# Patient Record
Sex: Female | Born: 1960
Health system: Southern US, Community
[De-identification: ages and names within clinical notes are randomized; demographics above are authoritative.]

## PROBLEM LIST (undated history)

## (undated) DIAGNOSIS — Z860100 Personal history of colon polyps, unspecified: Secondary | ICD-10-CM

## (undated) DIAGNOSIS — E7800 Pure hypercholesterolemia, unspecified: Secondary | ICD-10-CM

## (undated) DIAGNOSIS — K635 Polyp of colon: Secondary | ICD-10-CM

## (undated) DIAGNOSIS — E349 Endocrine disorder, unspecified: Secondary | ICD-10-CM

## (undated) DIAGNOSIS — K279 Peptic ulcer, site unspecified, unspecified as acute or chronic, without hemorrhage or perforation: Secondary | ICD-10-CM

## (undated) DIAGNOSIS — K5900 Constipation, unspecified: Secondary | ICD-10-CM

## (undated) DIAGNOSIS — Z8742 Personal history of other diseases of the female genital tract: Secondary | ICD-10-CM

## (undated) DIAGNOSIS — T7840XA Allergy, unspecified, initial encounter: Secondary | ICD-10-CM

## (undated) HISTORY — PX: COLONOSCOPY: SHX174

## (undated) HISTORY — DX: Personal history of colonic polyps: Z86.010

## (undated) HISTORY — DX: Allergy, unspecified, initial encounter: T78.40XA

## (undated) HISTORY — DX: Constipation, unspecified: K59.00

## (undated) HISTORY — DX: Peptic ulcer, site unspecified, unspecified as acute or chronic, without hemorrhage or perforation: K27.9

## (undated) HISTORY — PX: UPPER GASTROINTESTINAL ENDOSCOPY: SHX188

## (undated) HISTORY — DX: Personal history of other diseases of the female genital tract: Z87.42

## (undated) HISTORY — DX: Endocrine disorder, unspecified: E34.9

## (undated) HISTORY — PX: BUNIONECTOMY: SHX129

## (undated) HISTORY — DX: Pure hypercholesterolemia, unspecified: E78.00

## (undated) HISTORY — DX: Polyp of colon: K63.5

## (undated) HISTORY — PX: POLYPECTOMY: SHX149

## (undated) HISTORY — DX: Personal history of colon polyps, unspecified: Z86.0100

## (undated) HISTORY — PX: WISDOM TOOTH EXTRACTION: SHX21

## (undated) HISTORY — PX: OSTEOCHONDROMA EXCISION: SHX2137

---

## 2015-08-13 DIAGNOSIS — Z86010 Personal history of colonic polyps: Secondary | ICD-10-CM | POA: Insufficient documentation

## 2015-08-13 DIAGNOSIS — Z860101 Personal history of adenomatous and serrated colon polyps: Secondary | ICD-10-CM | POA: Insufficient documentation

## 2017-05-23 ENCOUNTER — Other Ambulatory Visit: Payer: Self-pay

## 2017-05-23 DIAGNOSIS — Z0000 Encounter for general adult medical examination without abnormal findings: Secondary | ICD-10-CM

## 2017-05-23 DIAGNOSIS — Z1329 Encounter for screening for other suspected endocrine disorder: Secondary | ICD-10-CM

## 2017-05-23 DIAGNOSIS — Z13220 Encounter for screening for lipoid disorders: Secondary | ICD-10-CM

## 2017-05-23 DIAGNOSIS — Z1321 Encounter for screening for nutritional disorder: Secondary | ICD-10-CM

## 2017-06-08 ENCOUNTER — Other Ambulatory Visit: Payer: 59 | Admitting: Internal Medicine

## 2017-06-08 DIAGNOSIS — Z13220 Encounter for screening for lipoid disorders: Secondary | ICD-10-CM

## 2017-06-08 DIAGNOSIS — Z0000 Encounter for general adult medical examination without abnormal findings: Secondary | ICD-10-CM | POA: Diagnosis not present

## 2017-06-08 DIAGNOSIS — Z1329 Encounter for screening for other suspected endocrine disorder: Secondary | ICD-10-CM | POA: Diagnosis not present

## 2017-06-08 DIAGNOSIS — Z1321 Encounter for screening for nutritional disorder: Secondary | ICD-10-CM | POA: Diagnosis not present

## 2017-06-09 LAB — CBC WITH DIFFERENTIAL/PLATELET
BASOS PCT: 1.3 %
Basophils Absolute: 61 cells/uL (ref 0–200)
EOS ABS: 132 {cells}/uL (ref 15–500)
Eosinophils Relative: 2.8 %
HCT: 38.9 % (ref 35.0–45.0)
HEMOGLOBIN: 13.3 g/dL (ref 11.7–15.5)
Lymphs Abs: 1880 cells/uL (ref 850–3900)
MCH: 30.9 pg (ref 27.0–33.0)
MCHC: 34.2 g/dL (ref 32.0–36.0)
MCV: 90.3 fL (ref 80.0–100.0)
MONOS PCT: 12 %
MPV: 9.4 fL (ref 7.5–12.5)
NEUTROS ABS: 2063 {cells}/uL (ref 1500–7800)
Neutrophils Relative %: 43.9 %
Platelets: 334 10*3/uL (ref 140–400)
RBC: 4.31 10*6/uL (ref 3.80–5.10)
RDW: 12.2 % (ref 11.0–15.0)
Total Lymphocyte: 40 %
WBC mixed population: 564 cells/uL (ref 200–950)
WBC: 4.7 10*3/uL (ref 3.8–10.8)

## 2017-06-09 LAB — COMPLETE METABOLIC PANEL WITH GFR
AG Ratio: 1.5 (calc) (ref 1.0–2.5)
ALT: 15 U/L (ref 6–29)
AST: 19 U/L (ref 10–35)
Albumin: 4.4 g/dL (ref 3.6–5.1)
Alkaline phosphatase (APISO): 53 U/L (ref 33–130)
BUN: 15 mg/dL (ref 7–25)
CALCIUM: 9.5 mg/dL (ref 8.6–10.4)
CO2: 29 mmol/L (ref 20–32)
Chloride: 106 mmol/L (ref 98–110)
Creat: 0.8 mg/dL (ref 0.50–1.05)
GFR, EST NON AFRICAN AMERICAN: 82 mL/min/{1.73_m2} (ref >=60)
GFR, Est African American: 95 mL/min/{1.73_m2} (ref >=60)
Globulin: 2.9 g/dL (calc) (ref 1.9–3.7)
Glucose, Bld: 106 mg/dL — ABNORMAL HIGH (ref 65–99)
Potassium: 4.4 mmol/L (ref 3.5–5.3)
Sodium: 140 mmol/L (ref 135–146)
Total Bilirubin: 0.5 mg/dL (ref 0.2–1.2)
Total Protein: 7.3 g/dL (ref 6.1–8.1)

## 2017-06-09 LAB — TSH: TSH: 1.46 mIU/L (ref 0.40–4.50)

## 2017-06-09 LAB — LIPID PANEL
CHOL/HDL RATIO: 3.7 (calc) (ref <5.0)
Cholesterol: 244 mg/dL — ABNORMAL HIGH (ref <200)
HDL: 66 mg/dL (ref >50)
LDL Cholesterol (Calc): 154 mg/dL (calc) — ABNORMAL HIGH
NON-HDL CHOLESTEROL (CALC): 178 mg/dL — AB (ref <130)
Triglycerides: 118 mg/dL (ref <150)

## 2017-06-09 LAB — VITAMIN D 25 HYDROXY (VIT D DEFICIENCY, FRACTURES): VIT D 25 HYDROXY: 31 ng/mL (ref 30–100)

## 2017-06-15 ENCOUNTER — Encounter: Payer: Self-pay | Admitting: Internal Medicine

## 2017-06-15 ENCOUNTER — Ambulatory Visit (INDEPENDENT_AMBULATORY_CARE_PROVIDER_SITE_OTHER): Payer: 59 | Admitting: Internal Medicine

## 2017-06-15 ENCOUNTER — Other Ambulatory Visit (HOSPITAL_COMMUNITY)
Admission: RE | Admit: 2017-06-15 | Discharge: 2017-06-15 | Disposition: A | Discharge status: Home / Self Care | Payer: 59 | Source: Ambulatory Visit | Attending: Internal Medicine | Admitting: Internal Medicine

## 2017-06-15 VITALS — BP 102/80 | HR 72 | Ht 62.0 in | Wt 165.0 lb

## 2017-06-15 DIAGNOSIS — J302 Other seasonal allergic rhinitis: Secondary | ICD-10-CM

## 2017-06-15 DIAGNOSIS — R232 Flushing: Secondary | ICD-10-CM | POA: Insufficient documentation

## 2017-06-15 DIAGNOSIS — R7309 Other abnormal glucose: Secondary | ICD-10-CM

## 2017-06-15 DIAGNOSIS — Z0000 Encounter for general adult medical examination without abnormal findings: Secondary | ICD-10-CM | POA: Diagnosis not present

## 2017-06-15 DIAGNOSIS — Z124 Encounter for screening for malignant neoplasm of cervix: Secondary | ICD-10-CM | POA: Diagnosis not present

## 2017-06-15 LAB — POCT URINALYSIS DIPSTICK
APPEARANCE: NORMAL
Bilirubin, UA: NEGATIVE
Glucose, UA: NEGATIVE
KETONES UA: NEGATIVE
Leukocytes, UA: NEGATIVE
NITRITE UA: NEGATIVE
ODOR: NORMAL
PH UA: 6 (ref 5.0–8.0)
PROTEIN UA: NEGATIVE
RBC UA: NEGATIVE
Spec Grav, UA: 1.015 (ref 1.010–1.025)
UROBILINOGEN UA: 0.2 U/dL

## 2017-06-15 MED ORDER — AZELASTINE-FLUTICASONE 137-50 MCG/ACT NA SUSP: NASAL | 1 refills | Status: DC

## 2017-06-15 MED ORDER — BEPOTASTINE BESILATE 1.5 % OP SOLN: 1.0000 [drp] | Freq: Two times a day (BID) | OPHTHALMIC | 1 refills | Status: DC

## 2017-06-15 MED ORDER — CLONIDINE HCL 0.1 MG PO TABS: 0.1000 mg | ORAL_TABLET | Freq: Two times a day (BID) | ORAL | 11 refills | Status: DC

## 2017-06-15 MED ORDER — CETIRIZINE HCL 10 MG PO CHEW: 10.0000 mg | CHEWABLE_TABLET | Freq: Every day | ORAL | 3 refills | Status: DC

## 2017-06-15 MED ORDER — FLUOXETINE HCL 20 MG PO TABS: 20.0000 mg | ORAL_TABLET | Freq: Every day | ORAL | 3 refills | Status: DC

## 2017-06-15 MED FILL — CloNIDine HCL 0.1 MG TAB: 0.1 | 30 days supply | Qty: 60 | Fill #0

## 2017-06-15 NOTE — Progress Notes (Signed)
Subjective:    Patient ID: Destiny Lewis, female    DOB: [birth year], 57 y.o.   MRN: [id number]  HPI  Pleasant 57 year old Female presents to the office for the first time today.  She and her husband moved here from [facility] around December 2018.  She has a daughter who lives here and is a Marine scientist.  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  He is a retired Immunologist.  She is a native of [place].  She has no history of serious illnesses or accidents.  She has allergic rhinitis and has failed [place] somewhat bothersome with allergies.  We will make referral to allergist for her.  She is currently using Zyrtec, and a combination of Flonase and Azelatine twice a day.  She also uses Bepreve eyedrops bid for allergic conjunctivitis.   osteochondroma left shin 1988, left foot  bunionectomy 1983, bunionectomy right foot June 14, 2004.  She had a colonoscopy done in 2015/06/15 which was her first colonoscopy.  She had 3 or 4 adenomatous polyps.  She was told to come back in a year but she had no health insurance coverage in 2016/06/14 so she deferred that procedure.  She will check with Dr. Carlean Purl to see if it is okay for her to wait 3years a 20/20 for her next exam.  She is allergic to Zithromax it causes blisters on her chest  2 pregnancies and no miscarriages.  Social history: She has a daughter age 7 and a son age 3.  Daughter has had issues with eating disorder and depression but currently doing well.  Son is had issues with anxiety.  One sister age 80 with history of metastatic breast cancer deceased in June 14, 2008.  One sister age 86 with history of Hashimoto's thyroiditis with vitiligo and GI problems.  One brother age 69 with history of MI in 06/15/14.  Another brother in excellent health at age 26.  Mother died at age 41 of congestive heart failure.  Father age 59 living with history of COPD and macular degeneration.  Patient does not smoke.  Social alcohol consumption consisting of 8  ounces of wine daily.  Has been trying to exercise and is on weight watchers.  She had a late menopause.  Is experiencing vaginal dryness and hot flashes.  Dr. internal still prescribed clonidine twice daily for hot flashes and that seems to help.  She is also on Prozac 20 mg daily for depression.  Old records indicate she has a history of peptic ulcer disease.  Records also indicates she was a 157.8 pounds in June 2018.  At that time she was exercising regularly and watching her diet.  She had a hemoglobin A1c in 06-15-2015 to 5.5%.  This will be repeated today due to elevated serum glucose.  Review of her labs from Jun 15, 2015 were within normal limits.  She had a Pap smear which was normal in 06-15-2014.  In 2014/06/15 she had a tendon tear right wrist due to an accident while exercising in the gym and was treated with a long-arm cast for 10 days.  She had tetanus immunization in 06/15/14 in [place].  She has been taking baby aspirin daily to prevent colon cancer according to old records.  This was suggested by her previous physician. Old records indicate she had Prevnar 68 in May 2017 and Tdap in February 2016.    Review of Systems  Constitutional: Negative.   Eyes:       History of  allergic conjunctivitis  Respiratory:       Allergy symptoms  Cardiovascular: Negative.   Gastrointestinal: Negative.   Genitourinary:       Vaginal dryness  Allergic/Immunologic: Positive for environmental allergies.  Neurological: Negative.   Psychiatric/Behavioral: Negative.        Objective:   Physical Exam  Constitutional: She is oriented to person, place, and time. She appears well-developed and well-nourished. No distress.  HENT:  Head: Normocephalic and atraumatic.  Right Ear: External ear normal.  Left Ear: External ear normal.  Mouth/Throat: Oropharynx is clear and moist.  Eyes: Pupils are equal, round, and reactive to light. Conjunctivae and EOM are normal. Right eye exhibits no discharge. Left eye exhibits no  discharge. No scleral icterus.  Neck: Neck supple. No JVD present. No thyromegaly present.  Cardiovascular: Normal rate, regular rhythm and normal heart sounds.  No murmur heard. Pulmonary/Chest: Effort normal and breath sounds normal. No respiratory distress. She has no wheezes. She has no rales. She exhibits no tenderness.  Abdominal: Soft. Bowel sounds are normal. She exhibits no distension and no mass. There is no tenderness. There is no rebound and no guarding.  Genitourinary:  Genitourinary Comments: Pap taken.  Bimanual normal.  Musculoskeletal: She exhibits no edema.  Lymphadenopathy:    She has no cervical adenopathy.  Neurological: She is alert and oriented to person, place, and time. She has normal reflexes. No cranial nerve deficit. Coordination normal.  Skin: Skin is warm and dry. No rash noted. She is not diaphoretic.  Psychiatric: She has a normal mood and affect. Her behavior is normal. Judgment and thought content normal.  Vitals reviewed.         Assessment & Plan:  Normal health maintenance exam  Vaginal dryness secondary to menopause  Hot flashes treated with clonidine  Allergic rhinitis and allergic conjunctivitis-referral to allergist for assessment  History of adenomatous colon polyps-patient to check with gastroenterologist regarding when it is ideal for her to have repeat colonoscopy.  I do not see copy of colonoscopy and old records provided.  Menopausal-I do not see copy of bone density study done in [place] and old records but patient thinks it was stable.  Recommend repeat study next year.  Health maintenance-recommend annual mammogram order placed  Plan: Hemoglobin A1c drawn due to elevated serum glucose.Marland Kitchen  Results pending.

## 2017-06-15 NOTE — Patient Instructions (Addendum)
It was a pleasure to see you today. Meds refilled.  Order for mammogram placed.  Allergy referral placed.  Return in 1 year or as needed.

## 2017-06-16 ENCOUNTER — Other Ambulatory Visit: Payer: Self-pay

## 2017-06-16 ENCOUNTER — Telehealth: Payer: Self-pay | Admitting: Internal Medicine

## 2017-06-16 LAB — HEMOGLOBIN A1C
HEMOGLOBIN A1C: 5.6 %{Hb} (ref <5.7)
MEAN PLASMA GLUCOSE: 114 (calc)
eAG (mmol/L): 6.3 (calc)

## 2017-06-16 MED ORDER — CLONIDINE HCL 0.1 MG PO TABS: 0.1000 mg | ORAL_TABLET | Freq: Two times a day (BID) | ORAL | 3 refills | Status: DC

## 2017-06-16 NOTE — Telephone Encounter (Signed)
Called patient to let her know that she needs to get in contact with Eye doctor on the eye drops Bepreve 1.5% for prior authorization. She acknowledge understanding and will contact them and thanked [place] for trying.

## 2017-06-19 LAB — CYTOLOGY - PAP: DIAGNOSIS: NEGATIVE

## 2017-07-18 ENCOUNTER — Telehealth: Payer: 59 | Admitting: Family

## 2017-07-18 DIAGNOSIS — L237 Allergic contact dermatitis due to plants, except food: Secondary | ICD-10-CM

## 2017-07-18 MED ORDER — PREDNISONE 5 MG PO TABS: 5.0000 mg | ORAL_TABLET | ORAL | 0 refills | Status: DC

## 2017-07-18 NOTE — Progress Notes (Signed)
Thank you for the details you included in the comment boxes. Those details are very helpful in determining the best course of treatment for you and help us to provide the best care.  We are sorry that you are not feeing well.  Here is how we plan to help!  Based on what you have shared with me it looks like you have had an allergic reaction to the oily resin from a group of plants.  This resin is very sticky, so it easily attaches to your skin, clothing, tools equipment, and pet's fur.    This blistering rash is often called poison ivy rash although it can come from contact with the leaves, stems and roots of poison ivy, poison oak and poison sumac.  The oily resin contains urushiol (u-ROO-she-ol) that produces a skin rash on exposed skin.  The severity of the rash depends on the amount of urushiol that gets on your skin.  A section of skin with more urushiol on it may develop a rash sooner.  The rash usually develops 12-48 hours after exposure and can last two to three weeks.  Your skin must come in direct contact with the plant's oil to be affected.  Blister fluid doesn't spread the rash.  However, if you come into contact with a piece of clothing or pet fur that has urushiol on it, the rash may spread out.  You can also transfer the oil to other parts of your body with your fingers.  Often the rash looks like a straight line because of the way the plant brushes against your skin.    I have developed the following plan to treat your condition Since your rash is widespread or has resulted in a large number of blisters, I have prescribed an oral corticosteroid.  Please follow these recommendations:  I have sent a prednisone dose pack to your chosen pharmacy. Be sure to follow the instructions carefully and complete the entire prescription. You may use Benadryl or Caladryl topical lotions to sooth the itch and remember cool, not hot, showers and baths can help relieve the itching!  Place cool, wet  compresses on the affected area for 15-30 minutes several times a day.  You may also take oral antihistamines, such as diphenhydramine (Benadryl, others), which may also help you sleep better.  Watch your skin for any purulent (pus) drainage or red streaking from the site.  If this occurs, contact your provider.  You may require an antibiotic for a skin infection.  Make sure that the clothes you were wearing as well as any towels or sheets that may have come in contact with the oil (urushiol) are washed in detergent and hot water.         What can you do to prevent this rash?  Avoid the plants.  Learn how to identify poison ivy, poison oak and poison sumac in all seasons.  When hiking or engaging in other activities that might expose you to these plants, try to stay on cleared pathways.  If camping, make sure you pitch your tent in an area free of these plants.  Keep pets from running through wooded areas so that urushiol doesn't accidentally stick to their fur, which you may touch.  Remove or kill the plants.  In your yard, you can get rid of poison ivy by applying an herbicide or pulling it out of the ground, including the roots, while wearing heavy gloves.  Afterward remove the gloves and thoroughly wash them and   your hands.  Don't burn poison ivy or related plants because the urushiol can be carried by smoke.  Wear protective clothing.  If needed, protect your skin by wearing socks, boots, pants, long sleeves and vinyl gloves.  Wash your skin right away.  Washing off the oil with soap and water within 30 minutes of exposure may reduce your chances of getting a poison ivy rash.  Even washing after an hour or so can help reduce the severity of the rash.  If you walk through some poison ivy and then later touch your shoes, you may get some urushiol on your hands, which may then transfer to your face or body by touching or rubbing.  If the contaminated object isn't cleaned, the urushiol on it can still  cause a skin reaction years later.    Be careful not to reuse towels after you have washed your skin.  Also carefully wash clothing in detergent and hot water to remove all traces of the oil.  Handle contaminated clothing carefully so you don't transfer the urushiol to yourself, furniture, rugs or appliances.  Remember that pets can carry the oil on their fur and paws.  If you think your pet may be contaminated with urushiol, put on some long rubber gloves and give your pet a bath.  Finally, be careful not to burn these plants as the smoke can contain traces of the oil.  Inhaling the smoke may result in difficulty breathing. If that occurred you should see a physician as soon as possible.  See your doctor right away if:   The reaction is severe or widespread  You inhaled the smoke from burning poison ivy and are having difficulty breathing  Your skin continues to swell  The rash affects your eyes, mouth or genitals  Blisters are oozing pus  You develop a fever greater than 100 F (37.8 C)  The rash doesn't get better within a few weeks.  If you scratch the poison ivy rash, bacteria under your fingernails may cause the skin to become infected.  See your doctor if pus starts oozing from the blisters.  Treatment generally includes antibiotics.  Poison ivy treatments are usually limited to self-care methods.  And the rash typically goes away on its own in two to three weeks.     If the rash is widespread or results in a large number of blisters, your doctor may prescribe an oral corticosteroid, such as prednisone.  If a bacterial infection has developed at the rash site, your doctor may give you a prescription for an oral antibiotic.  MAKE SURE YOU   Understand these instructions.  Will watch your condition.  Will get help right away if you are not doing well or get worse.  Thank you for choosing an e-visit. Your e-visit answers were reviewed by a board certified advanced  clinical practitioner to complete your personal care plan. Depending upon the condition, your plan could have included both over the counter or prescription medications.  Please review your pharmacy choice. If there is a problem you may use MyChart messaging to have the prescription routed to another pharmacy.   Your safety is important to us. If you have drug allergies check your prescription carefully.  You can use MyChart to ask questions about today's visit, request a non-urgent call back, or ask for a work or school excuse for 24 hours related to this e-Visit. If it has been greater than 24 hours you will need to follow up with   your provider, or enter a new e-Visit to address those concerns.   You will get an email in the next two days asking about your experience. I hope that your e-visit has been valuable and will speed your recovery Thank you for choosing an e-visit.       

## 2017-07-20 ENCOUNTER — Ambulatory Visit: Payer: 59 | Admitting: Allergy

## 2017-07-24 MED FILL — CloNIDine HCL 0.1 MG TAB: 0.1 | 30 days supply | Qty: 60 | Fill #1

## 2017-07-25 ENCOUNTER — Other Ambulatory Visit: Payer: Self-pay | Admitting: Urgent Care

## 2017-07-25 DIAGNOSIS — L237 Allergic contact dermatitis due to plants, except food: Secondary | ICD-10-CM

## 2017-07-25 MED ORDER — PREDNISONE 10 MG PO TABS: ORAL_TABLET | ORAL | 0 refills | Status: DC

## 2017-07-25 NOTE — Progress Notes (Signed)
Telephone encounter with patient in which patient reported coming into contact with poison oak. Has had contact dermatitis of arms that spread to torso. She has tried low dose prednisone of 5mg . Symptoms persist. Will use 10 day prednisone course.

## 2017-07-27 ENCOUNTER — Ambulatory Visit
Admission: RE | Admit: 2017-07-27 | Discharge: 2017-07-27 | Disposition: A | Discharge status: Home / Self Care | Payer: 59 | Source: Ambulatory Visit | Attending: Internal Medicine | Admitting: Internal Medicine

## 2017-07-27 DIAGNOSIS — Z1231 Encounter for screening mammogram for malignant neoplasm of breast: Secondary | ICD-10-CM | POA: Diagnosis not present

## 2017-07-27 DIAGNOSIS — Z0000 Encounter for general adult medical examination without abnormal findings: Secondary | ICD-10-CM

## 2017-07-31 ENCOUNTER — Other Ambulatory Visit: Payer: Self-pay | Admitting: Urgent Care

## 2017-07-31 DIAGNOSIS — L237 Allergic contact dermatitis due to plants, except food: Secondary | ICD-10-CM

## 2017-07-31 MED ORDER — PREDNISONE 10 MG PO TABS: ORAL_TABLET | ORAL | 0 refills | Status: DC

## 2017-07-31 MED FILL — predniSONE 10 MG TABS: 10 | 10 days supply | Qty: 30 | Fill #0

## 2017-08-21 MED FILL — CloNIDine HCL 0.1 MG TAB: 0.1 | 30 days supply | Qty: 60 | Fill #2

## 2017-08-21 MED FILL — FLUoxetine HCL 20 MG CAPS: 20 | 90 days supply | Qty: 90 | Fill #0

## 2017-09-01 ENCOUNTER — Other Ambulatory Visit: Payer: Self-pay

## 2017-09-01 DIAGNOSIS — H524 Presbyopia: Secondary | ICD-10-CM | POA: Diagnosis not present

## 2017-09-01 MED ORDER — VALACYCLOVIR HCL 500 MG PO TABS: ORAL_TABLET | ORAL | 11 refills | Status: DC

## 2017-09-01 MED FILL — VALACYCLOVIR HCL 500 MG TAB: 500 | 5 days supply | Qty: 10 | Fill #0

## 2017-09-01 NOTE — Telephone Encounter (Signed)
Patient called states she is having a herpes outbreak and is needing a refill on her medication.

## 2017-09-06 ENCOUNTER — Ambulatory Visit: Payer: 59 | Admitting: Allergy

## 2017-09-06 ENCOUNTER — Encounter: Payer: Self-pay | Admitting: Allergy

## 2017-09-06 VITALS — BP 116/78 | HR 76 | Temp 98.0°F | Resp 16 | Ht 61.5 in | Wt 160.4 lb

## 2017-09-06 DIAGNOSIS — J309 Allergic rhinitis, unspecified: Secondary | ICD-10-CM

## 2017-09-06 DIAGNOSIS — H1010 Acute atopic conjunctivitis, unspecified eye: Secondary | ICD-10-CM | POA: Diagnosis not present

## 2017-09-06 MED ORDER — LEVOCETIRIZINE DIHYDROCHLORIDE 5 MG PO TABS: 5.0000 mg | ORAL_TABLET | Freq: Every evening | ORAL | 5 refills | Status: DC

## 2017-09-06 MED ORDER — IPRATROPIUM BROMIDE 0.03 % NA SOLN: 2.0000 | Freq: Three times a day (TID) | NASAL | 5 refills | Status: DC

## 2017-09-06 MED ORDER — OLOPATADINE HCL 0.2 % OP SOLN: 1.0000 [drp] | Freq: Every day | OPHTHALMIC | 5 refills | Status: DC | PRN

## 2017-09-06 MED FILL — LEVOCETIRIZINE 5 MG TABLET: 5 | 30 days supply | Qty: 30 | Fill #0

## 2017-09-06 MED FILL — IPRATROPIUM 0.03% SPRAY: 0.03 | 29 days supply | Qty: 30 | Fill #0

## 2017-09-06 MED FILL — OLOPATADINE HCL 0.2% EYE DR: 0.2 | 25 days supply | Qty: 3 | Fill #0

## 2017-09-06 NOTE — Patient Instructions (Addendum)
Allergic rhinoconjunctivitis  -Environmental allergy skin prick testing today is positive to grass, weed, mold, cockroach  -Allergen avoidance measures discussed and provided  -trial Xyzal 5mg  daily - this replaces zyrtec  -for nasal congestion/drainage use nasal atrovent 2 sprays each nostril up 3-4 times a day as needed  -For itchy, watery, red eyes use Pazeo or Pataday 1 drop each eye as needed daily  -allergen immunotherapy discussed today including protocol, benefits and risk.  Informational handout provided.  If interested in this therapuetic option you can check with your insurance carrier for coverage.  Let us know if you would like to proceed with this option.    Follow-up 4-6 months or sooner if needed

## 2017-09-06 NOTE — Progress Notes (Signed)
New Patient Note  RE: Destiny Lewis MRN: [id number] DOB: 10-05-1960 Date of Office Visit: 09/06/2017  Referring provider: Elby Showers, MD Primary care provider: Elby Showers, MD  Chief Complaint: allergies  History of present illness: Destiny Lewis is a 57 y.o. female presenting today for consultation for allergic rhinitis.    She reports her allergies over this past winter and spring were bad.  Symptoms have been improved over the summer.  Symptoms include sinus pressure, nasal congestion/drainage with PND and throat clearing, hoarsness, red/itchy eyes, itchy ears.  During the winter she used a humidier in bedroom.  She has tried zyrtec, flonase, azelastine and bepreve eye drops.  She states she took this regimen during winter and spring.  She states she still had to take "severe sinus" medication as well.  The eye drop did help with itch but not the red eye.  In the past she reports she has tried Human resources officer and Claritin without much relief of symptoms.  She has had allergy testing several years ago that was positive to trees/grass pollen she believes.  This was done in [place], [place].  She moved to [place] area about 7 months ago.  Allergy symptoms have been worse here.   She reports she usually will get 1-2 sinus infections/year treated with 1 round of antibiotics.   No history of asthma, eczema or food allergy.    Review of systems: Review of Systems  Constitutional: Negative for chills, fever and malaise/fatigue.  HENT: Positive for congestion and sinus pain. Negative for ear discharge, ear pain, nosebleeds and sore throat.   Eyes: Positive for redness. Negative for pain and discharge.  Respiratory: Negative for cough, shortness of breath and wheezing.   Cardiovascular: Negative for chest pain.  Gastrointestinal: Negative for abdominal pain, constipation, diarrhea, heartburn, nausea and vomiting.  Musculoskeletal: Negative for joint pain.  Skin: Negative for itching and rash.    Neurological: Negative for headaches.    All other systems negative unless noted above in HPI  Past medical history: Past Medical History:  Diagnosis Date  . History of colon polyps     Past surgical history: Past Surgical History:  Procedure Laterality Date  . OSTEOCHONDROMA EXCISION      Family history:  Family History  Problem Relation Age of Onset  . Skin cancer Mother   . Heart attack Mother   . Hypertension Mother   . Arthritis Mother   . Cancer Father   . Colon cancer Father   . COPD Father   . Breast cancer Sister   . Hypertension Sister   . Hypertension Brother   . Breast cancer Maternal Aunt     Social history: She lives in a home without carpeting with gas heating and central cooling.  2 dogs and 1 cat in the home.  No concern for water damage, mildew or roaches in the home.  She is a Therapist, sports in endoscopy.  No smoking history.    Medication List: Allergies as of 09/06/2017      Reactions   Azithromycin Other (See Comments)      Medication List        Accurate as of 09/06/17  4:40 PM. Always use your most recent med list.          aspirin 81 MG tablet Take 81 mg by mouth daily.   Azelastine-Fluticasone 137-50 MCG/ACT Susp Use bid   Bepotastine Besilate 1.5 % Soln Commonly known as:  BEPREVE Place 1 drop into both eyes  2 (two) times daily.   cetirizine 10 MG chewable tablet Commonly known as:  ZYRTEC Chew 1 tablet (10 mg total) by mouth daily.   cholecalciferol 1000 units tablet Commonly known as:  VITAMIN D Take 1,000 Units by mouth daily.   cloNIDine 0.1 MG tablet Commonly known as:  CATAPRES Take 1 tablet (0.1 mg total) by mouth 2 (two) times daily.   Flaxseed Oil 1200 MG Caps Take 1 capsule by mouth 1 day or 1 dose.   FLUoxetine 20 MG tablet Commonly known as:  PROZAC Take 1 tablet (20 mg total) by mouth daily.   ipratropium 0.03 % nasal spray Commonly known as:  ATROVENT Place 2 sprays into both nostrils 3 (three) times  daily.   levocetirizine 5 MG tablet Commonly known as:  XYZAL Take 1 tablet (5 mg total) by mouth every evening.   LUMIFY 0.025 % Soln Generic drug:  Brimonidine Tartrate Apply 1 drop to eye 2 (two) times daily.   magnesium gluconate 500 MG tablet Commonly known as:  MAGONATE Take 500 mg by mouth 2 (two) times daily.   Olopatadine HCl 0.2 % Soln Commonly known as:  PATADAY Place 1 drop into both eyes daily as needed.   predniSONE 10 MG tablet Commonly known as:  DELTASONE Day 1-5: Take 4 tablets daily. Day 6-10: Take 2 tablets daily. Take tablets with breakfast.   TYLENOL SINUS SEVERE 5-325-200 MG Tabs Generic drug:  Phenylephrine-APAP-guaiFENesin Take 1 capsule by mouth 2 (two) times daily.   valACYclovir 500 MG tablet Commonly known as:  VALTREX One po bid x 5 days   VITAMIN B 12 PO Take 500 mg by mouth 1 day or 1 dose.   vitamin C 1000 MG tablet Take 1,000 mg by mouth daily.       Known medication allergies: Allergies  Allergen Reactions  . Azithromycin Other (See Comments)     Physical examination: Blood pressure 116/78, pulse 76, temperature 98 F (36.7 C), temperature source Oral, resp. rate 16, height 5' 1.5" (1.562 m), weight 160 lb 6.4 oz (72.8 kg), SpO2 95 %.  General: Alert, interactive, in no acute distress. HEENT: PERRLA, TMs pearly gray, turbinates minimally edematous without discharge, post-pharynx non erythematous, mild cobblestoning of post oropharynx. Neck: Supple without lymphadenopathy. Lungs: Clear to auscultation without wheezing, rhonchi or rales. {no increased work of breathing. CV: Normal S1, S2 without murmurs. Abdomen: Nondistended, nontender. Skin: Warm and dry, without lesions or rashes. Extremities:  No clubbing, cyanosis or edema. Neuro:   Grossly intact.  Diagnositics/Labs:  Allergy testing: environmental allergy skin prick testing is positive ot [place], mugwort Intradermal testing is positive to mold mix 2, mold mix 4,  cockroach Allergy testing results were read and interpreted by provider, documented by clinical staff.   Assessment and plan:   Allergic rhinoconjunctivitis  -Environmental allergy skin prick testing today is positive to grass, weed, mold, cockroach  -Allergen avoidance measures discussed and provided  -trial Xyzal 5mg  daily - this replaces zyrtec  -for nasal congestion/drainage use nasal atrovent 2 sprays each nostril up 3-4 times a day as needed  -For itchy, watery, red eyes use Pazeo or Pataday 1 drop each eye as needed daily  -allergen immunotherapy discussed today including protocol, benefits and risk.  Informational handout provided.  If interested in this therapuetic option you can check with your insurance carrier for coverage.  Let us know if you would like to proceed with this option.    Follow-up 4-6 months or sooner if needed  I appreciate the opportunity to take part in Lecia's care. Please do not hesitate to contact me with questions.  Sincerely,   Prudy Feeler, MD Allergy/Immunology [facility]

## 2017-09-15 ENCOUNTER — Other Ambulatory Visit: Payer: Self-pay | Admitting: Urgent Care

## 2017-09-15 MED ORDER — MONTELUKAST SODIUM 10 MG PO TABS: 10.0000 mg | ORAL_TABLET | Freq: Every day | ORAL | 1 refills | Status: DC

## 2017-09-15 MED ORDER — TOBRAMYCIN 0.3 % OP SOLN: 1.0000 [drp] | OPHTHALMIC | 0 refills | Status: DC

## 2017-09-15 MED ORDER — PSEUDOEPHEDRINE HCL ER 120 MG PO TB12: 120.0000 mg | ORAL_TABLET | Freq: Two times a day (BID) | ORAL | 3 refills | Status: DC

## 2017-09-15 MED FILL — MONTELUKAST SOD 10 MG TAB: 10 | 90 days supply | Qty: 90 | Fill #0

## 2017-09-15 MED FILL — TOBRAMYCIN 0.3 % SOLN: 0.3 | 8 days supply | Qty: 5 | Fill #0

## 2017-09-15 MED FILL — CloNIDine HCL 0.1 MG TAB: 0.1 | 30 days supply | Qty: 60 | Fill #3

## 2017-09-15 NOTE — Progress Notes (Signed)
Telephone encounter with patient completed today.  Patient reports that she woke up with acute onset of bilateral eye pain, eye redness, matted eyes, frontal headache.  She is taking her allergy medications of Xyzal, Flonase.  Has previously been prescribed Singulair.  She has a history of allergic reaction to poison ivy but has not spent time in her yard or doing yard work.  Denies itching of her eyes, trauma.  Will start tobramycin to help her with bilateral conjunctivitis.  She has allergies listed for azithromycin and therefore we cannot use erythromycin.  She does not wear contact lenses.  He is to maintain her allergy medications including Pataday.

## 2017-09-18 ENCOUNTER — Other Ambulatory Visit: Payer: Self-pay | Admitting: Urgent Care

## 2017-09-18 MED ORDER — DEXAMETHASONE 0.1 % OP SUSP: 2.0000 [drp] | OPHTHALMIC | 0 refills | Status: DC

## 2017-09-18 MED FILL — PREDNISOLONE AC 1% EYE DROP: 1 | 12 days supply | Qty: 15 | Fill #0

## 2017-09-18 MED FILL — VALACYCLOVIR HCL 500 MG TAB: 500 | 5 days supply | Qty: 10 | Fill #1

## 2017-09-18 NOTE — Progress Notes (Signed)
Dexamethasone ophthalmic was too expensive.  We will instead be using Pred forte.  Prescription given by phone.

## 2017-09-18 NOTE — Progress Notes (Signed)
Telephone encounter with patient.  She reports that she had improvement when she started tobramycin but today started having issues again with both her eyes being red, itchy and stinging.  She has been using tobramycin as prescribed together with Pataday.  She does admit that she has not removed her eyelash extensions.  She does not wear contacts.  Has a history of bad allergies.  Will start patient on dexamethasone taper over the next 9 days.  She is to maintain tobramycin.  Follow-up in 24 to 48 hours if there is no improvement in her symptoms.  Patient was instructed to remove eyelash extensions and any other eye products.

## 2017-10-10 MED FILL — LEVOCETIRIZINE 5 MG TABLET: 5 | 30 days supply | Qty: 30 | Fill #1

## 2017-10-10 MED FILL — CloNIDine HCL 0.1 MG TAB: 0.1 | 30 days supply | Qty: 60 | Fill #4

## 2017-11-06 MED FILL — LEVOCETIRIZINE 5 MG TABLET: 5 | 30 days supply | Qty: 30 | Fill #2

## 2017-11-06 MED FILL — CloNIDine HCL 0.1 MG TAB: 0.1 | 30 days supply | Qty: 60 | Fill #5

## 2017-11-06 MED FILL — VALACYCLOVIR HCL 500 MG TAB: 500 | 5 days supply | Qty: 10 | Fill #2

## 2017-11-16 MED FILL — FLUoxetine HCL 20 MG CAPS: 20 | 90 days supply | Qty: 90 | Fill #1

## 2017-11-22 ENCOUNTER — Encounter: Payer: Self-pay | Admitting: Internal Medicine

## 2017-11-22 ENCOUNTER — Ambulatory Visit (AMBULATORY_SURGERY_CENTER): Payer: 59 | Admitting: *Deleted

## 2017-11-22 VITALS — Ht 63.0 in | Wt 161.0 lb

## 2017-11-22 DIAGNOSIS — Z86010 Personal history of colonic polyps: Secondary | ICD-10-CM

## 2017-11-22 DIAGNOSIS — Z800 Family history of malignant neoplasm of digestive organs: Secondary | ICD-10-CM

## 2017-11-22 NOTE — Progress Notes (Signed)
No egg or soy allergy known to patient  No issues with past sedation with any surgeries  or procedures, no intubation problems  No diet pills per patient No home 02 use per patient  No blood thinners per patient  Pt denies issues with constipation  No A fib or A flutter  EMMI video sent to pt's e mail pt declined   

## 2017-11-24 DIAGNOSIS — L821 Other seborrheic keratosis: Secondary | ICD-10-CM | POA: Diagnosis not present

## 2017-11-24 DIAGNOSIS — D225 Melanocytic nevi of trunk: Secondary | ICD-10-CM | POA: Diagnosis not present

## 2017-11-24 DIAGNOSIS — D1801 Hemangioma of skin and subcutaneous tissue: Secondary | ICD-10-CM | POA: Diagnosis not present

## 2017-11-24 DIAGNOSIS — L718 Other rosacea: Secondary | ICD-10-CM | POA: Diagnosis not present

## 2017-11-24 DIAGNOSIS — L814 Other melanin hyperpigmentation: Secondary | ICD-10-CM | POA: Diagnosis not present

## 2017-12-01 MED FILL — CloNIDine HCL 0.1 MG TAB: 0.1 | 30 days supply | Qty: 60 | Fill #6

## 2017-12-01 MED FILL — LEVOCETIRIZINE 5 MG TABLET: 5 | 30 days supply | Qty: 30 | Fill #3

## 2017-12-04 ENCOUNTER — Encounter: Payer: Self-pay | Admitting: Internal Medicine

## 2017-12-04 ENCOUNTER — Ambulatory Visit (AMBULATORY_SURGERY_CENTER): Payer: 59 | Admitting: Internal Medicine

## 2017-12-04 VITALS — BP 107/64 | HR 64 | Temp 99.1°F | Resp 13 | Ht 63.0 in | Wt 161.0 lb

## 2017-12-04 DIAGNOSIS — E669 Obesity, unspecified: Secondary | ICD-10-CM | POA: Diagnosis not present

## 2017-12-04 DIAGNOSIS — D125 Benign neoplasm of sigmoid colon: Secondary | ICD-10-CM | POA: Diagnosis not present

## 2017-12-04 DIAGNOSIS — K635 Polyp of colon: Secondary | ICD-10-CM

## 2017-12-04 DIAGNOSIS — D128 Benign neoplasm of rectum: Secondary | ICD-10-CM

## 2017-12-04 DIAGNOSIS — D127 Benign neoplasm of rectosigmoid junction: Secondary | ICD-10-CM | POA: Diagnosis not present

## 2017-12-04 DIAGNOSIS — Z800 Family history of malignant neoplasm of digestive organs: Secondary | ICD-10-CM | POA: Insufficient documentation

## 2017-12-04 DIAGNOSIS — D129 Benign neoplasm of anus and anal canal: Secondary | ICD-10-CM

## 2017-12-04 DIAGNOSIS — Z86010 Personal history of colonic polyps: Secondary | ICD-10-CM | POA: Diagnosis not present

## 2017-12-04 DIAGNOSIS — I10 Essential (primary) hypertension: Secondary | ICD-10-CM | POA: Diagnosis not present

## 2017-12-04 MED ORDER — SODIUM CHLORIDE 0.9 % IV SOLN: 500.0000 mL | Freq: Once | INTRAVENOUS | Status: DC

## 2017-12-04 NOTE — Op Note (Signed)
[place] Patient Name: Destiny Lewis Procedure Date: 12/04/2017 8:16 AM MRN: [id number] Endoscopist: Gatha Mayer , MD Age: 57 Referring MD:  Date of Birth: 05-22-1960 Gender: Female Account #: [id number] Procedure:                Colonoscopy Indications:              Surveillance: Piecemeal removal of large sessile                            adenoma last colonoscopy (< 3 yrs) Medicines:                Propofol per Anesthesia, Monitored Anesthesia Care Procedure:                Pre-Anesthesia Assessment:                           - Prior to the procedure, a History and Physical                            was performed, and patient medications and                            allergies were reviewed. The patient's tolerance of                            previous anesthesia was also reviewed. The risks                            and benefits of the procedure and the sedation                            options and risks were discussed with the patient.                            All questions were answered, and informed consent                            was obtained. Prior Anticoagulants: The patient has                            taken no previous anticoagulant or antiplatelet                            agents. ASA Grade Assessment: II - A patient with                            mild systemic disease. After reviewing the risks                            and benefits, the patient was deemed in                            satisfactory condition to undergo the procedure.  After obtaining informed consent, the colonoscope                            was passed under direct vision. Throughout the                            procedure, the patient's blood pressure, pulse, and                            oxygen saturations were monitored continuously. The                            Colonoscope was introduced through the anus and   advanced to the the cecum, identified by                            appendiceal orifice and ileocecal valve. The                            colonoscopy was performed without difficulty. The                            patient tolerated the procedure well. The quality                            of the bowel preparation was good. The bowel                            preparation used was Miralax. The ileocecal valve,                            appendiceal orifice, and rectum were photographed. Scope In: 8:29:59 AM Scope Out: 8:47:40 AM Scope Withdrawal Time: 0 hours 15 minutes 10 seconds  Total Procedure Duration: 0 hours 17 minutes 41 seconds  Findings:                 The perianal and digital rectal examinations were                            normal.                           Two sessile polyps were found in the rectum and mid                            sigmoid colon. The polyps were diminutive in size.                            These polyps were removed with a cold biopsy                            forceps. Resection and retrieval were complete.                            Verification of  patient identification for the                            specimen was done. Estimated blood loss was minimal.                           The exam was otherwise without abnormality on                            direct and retroflexion views. Complications:            No immediate complications. Estimated Blood Loss:     Estimated blood loss was minimal. Impression:               - Two diminutive polyps in the rectum and in the                            mid sigmoid colon, removed with a cold biopsy                            forceps. Resected and retrieved.                           - The examination was otherwise normal on direct                            and retroflexion views.                           - Personal history of colonic polyps. 07/2015                            adenomas - 2cm, 1 cm and 4  mm. Family history of                            colon cancer in father. Recommendation:           - Patient has a contact number available for                            emergencies. The signs and symptoms of potential                            delayed complications were discussed with the                            patient. Return to normal activities tomorrow.                            Written discharge instructions were provided to the                            patient.                           - Resume previous diet.                           -  Continue present medications.                           - Repeat colonoscopy is recommended for                            surveillance. The colonoscopy date will be                            determined after pathology results from today's                            exam become available for review. Gatha Mayer, MD 12/04/2017 8:53:50 AM This report has been signed electronically.

## 2017-12-04 NOTE — Patient Instructions (Signed)
YOU HAD AN ENDOSCOPIC PROCEDURE TODAY AT THE [facility]:   Refer to the procedure report that was given to you for any specific questions about what was found during the examination.  If the procedure report does not answer your questions, please call your gastroenterologist to clarify.  If you requested that your care partner not be given the details of your procedure findings, then the procedure report has been included in a sealed envelope for you to review at your convenience later.  YOU SHOULD EXPECT: Some feelings of bloating in the abdomen. Passage of more gas than usual.  Walking can help get rid of the air that was put into your GI tract during the procedure and reduce the bloating. If you had a lower endoscopy (such as a colonoscopy or flexible sigmoidoscopy) you may notice spotting of blood in your stool or on the toilet paper. If you underwent a bowel prep for your procedure, you may not have a normal bowel movement for a few days.  Please Note:  You might notice some irritation and congestion in your nose or some drainage.  This is from the oxygen used during your procedure.  There is no need for concern and it should clear up in a day or so.  SYMPTOMS TO REPORT IMMEDIATELY:   Following lower endoscopy (colonoscopy or flexible sigmoidoscopy):  Excessive amounts of blood in the stool  Significant tenderness or worsening of abdominal pains  Swelling of the abdomen that is new, acute  Fever of 100F or higher   For urgent or emergent issues, a gastroenterologist can be reached at any hour by calling [phone].   DIET:  We do recommend a small meal at first, but then you may proceed to your regular diet.  Drink plenty of fluids but you should avoid alcoholic beverages for 24 hours.  ACTIVITY:  You should plan to take it easy for the rest of today and you should NOT DRIVE or use heavy machinery until tomorrow (because of the sedation medicines used during the test).     FOLLOW UP: Our staff will call the number listed on your records the next business day following your procedure to check on you and address any questions or concerns that you may have regarding the information given to you following your procedure. If we do not reach you, we will leave a message.  However, if you are feeling well and you are not experiencing any problems, there is no need to return our call.  We will assume that you have returned to your regular daily activities without incident.  If any biopsies were taken you will be contacted by phone or by letter within the next 1-3 weeks.  Please call us at [phone] if you have not heard about the biopsies in 3 weeks.    SIGNATURES/CONFIDENTIALITY: You and/or your care partner have signed paperwork which will be entered into your electronic medical record.  These signatures attest to the fact that that the information above on your After Visit Summary has been reviewed and is understood.  Full responsibility of the confidentiality of this discharge information lies with you and/or your care-partner.   Handout was given to your care partner on polyps. You may resume your current medications today. Await biopsy results. Please call if any questions or concerns.   

## 2017-12-04 NOTE — Progress Notes (Signed)
No problems noted in the recovery room. maw 

## 2017-12-04 NOTE — Progress Notes (Signed)
Called to room to assist during endoscopic procedure.  Patient ID and intended procedure confirmed with present staff. Received instructions for my participation in the procedure from the performing physician.  

## 2017-12-04 NOTE — Progress Notes (Signed)
Report to PACU, RN, vss, BBS= Clear.  

## 2017-12-05 ENCOUNTER — Telehealth: Payer: Self-pay

## 2017-12-05 NOTE — Telephone Encounter (Signed)
  Follow up Call-  Call back number 12/04/2017  Permission to leave phone message Yes     Patient questions:  Do you have a fever, pain , or abdominal swelling? No. Pain Score  0 *  Have you tolerated food without any problems? Yes.    Have you been able to return to your normal activities? Yes.    Do you have any questions about your discharge instructions: Diet   No. Medications  No. Follow up visit  No.  Do you have questions or concerns about your Care? No.  Actions: * If pain score is 4 or above: No action needed, pain <4.

## 2017-12-11 DIAGNOSIS — J310 Chronic rhinitis: Secondary | ICD-10-CM | POA: Diagnosis not present

## 2017-12-11 DIAGNOSIS — R51 Headache: Secondary | ICD-10-CM | POA: Diagnosis not present

## 2017-12-11 DIAGNOSIS — J343 Hypertrophy of nasal turbinates: Secondary | ICD-10-CM | POA: Diagnosis not present

## 2017-12-11 DIAGNOSIS — J342 Deviated nasal septum: Secondary | ICD-10-CM | POA: Diagnosis not present

## 2017-12-11 MED FILL — FLUTICASONE PROP 50 MCG SPR: 50 | 30 days supply | Qty: 16 | Fill #0

## 2017-12-12 ENCOUNTER — Encounter: Payer: Self-pay | Admitting: Internal Medicine

## 2017-12-12 NOTE — Progress Notes (Signed)
1 adenoma 1 benign mucosa recall 2024 My Chart

## 2017-12-13 ENCOUNTER — Other Ambulatory Visit (HOSPITAL_COMMUNITY): Payer: Self-pay | Admitting: Otolaryngology

## 2017-12-13 DIAGNOSIS — R51 Headache: Principal | ICD-10-CM

## 2017-12-13 DIAGNOSIS — G8929 Other chronic pain: Secondary | ICD-10-CM

## 2017-12-13 DIAGNOSIS — J329 Chronic sinusitis, unspecified: Secondary | ICD-10-CM

## 2017-12-26 MED FILL — CloNIDine HCL 0.1 MG TAB: 0.1 | 30 days supply | Qty: 60 | Fill #7

## 2017-12-26 MED FILL — LEVOCETIRIZINE 5 MG TABLET: 5 | 30 days supply | Qty: 30 | Fill #4

## 2017-12-29 MED FILL — VALACYCLOVIR HCL 500 MG TAB: 500 | 5 days supply | Qty: 10 | Fill #3

## 2018-01-10 ENCOUNTER — Ambulatory Visit: Payer: 59 | Admitting: Allergy

## 2018-01-11 ENCOUNTER — Ambulatory Visit (HOSPITAL_COMMUNITY)
Admission: RE | Admit: 2018-01-11 | Discharge: 2018-01-11 | Disposition: A | Discharge status: Home / Self Care | Payer: 59 | Source: Ambulatory Visit | Attending: Otolaryngology | Admitting: Otolaryngology

## 2018-01-11 ENCOUNTER — Ambulatory Visit: Payer: 59 | Admitting: Allergy

## 2018-01-11 ENCOUNTER — Encounter: Payer: Self-pay | Admitting: Allergy

## 2018-01-11 VITALS — BP 122/80 | HR 72 | Resp 16

## 2018-01-11 DIAGNOSIS — R51 Headache: Secondary | ICD-10-CM | POA: Insufficient documentation

## 2018-01-11 DIAGNOSIS — H1013 Acute atopic conjunctivitis, bilateral: Secondary | ICD-10-CM | POA: Diagnosis not present

## 2018-01-11 DIAGNOSIS — J3489 Other specified disorders of nose and nasal sinuses: Secondary | ICD-10-CM | POA: Insufficient documentation

## 2018-01-11 DIAGNOSIS — J3089 Other allergic rhinitis: Secondary | ICD-10-CM

## 2018-01-11 DIAGNOSIS — G8929 Other chronic pain: Secondary | ICD-10-CM

## 2018-01-11 DIAGNOSIS — J329 Chronic sinusitis, unspecified: Secondary | ICD-10-CM

## 2018-01-11 MED ORDER — AZELASTINE HCL 0.1 % NA SOLN: 2.0000 | Freq: Two times a day (BID) | NASAL | 5 refills | Status: DC

## 2018-01-11 NOTE — Patient Instructions (Addendum)
Allergic rhinoconjunctivitis  -continue avoidance measures for grass pollen, weed pollen, mold, cockroach  -take Xyzal 5mg  daily  -for nasal congestion use Flonase 2 sprays each nostril daily as needed.  Use for 1-2 weeks at a time before stopping once symptoms improve  -for nasal drainage/post-nasal drip use Astelin 2 sprays each nostril twice a day as needed  -continue use of lumify as directed by your eye doctor for control of red eye symptoms  -allergen immunotherapy has been discussed at previous visit and remains an option if medication management is not effective for you in controlling your allergy symptoms.    Follow-up 4-6 months or sooner if needed

## 2018-01-11 NOTE — Progress Notes (Addendum)
Follow-up Note  RE: Destiny Lewis MRN: [id number] DOB: Sep 17, 1960 Date of Office Visit: 01/11/2018   History of present illness: Destiny Lewis is a 57 y.o. female presenting today for follow-up of allergic rhinoconjunctivitis.  She was last seen in the office for initial visit on 09/06/17.  She has positive allergy skin testing to pollens, mold and cockroach.  She states since she did well after this visit however the past several weeks she has had increased post-nasal drainage and HA and has continued to have red eyes.  She does see an an doctor who has prescribe lumify for her and she states it does help with her red eye for about 8 hr relief.  She did see Dr. Wilburn Cornelia, ENT, since her last visit and states he recommended she stop singulair and advised she use flonase, xyzal and nasal saline rinse.   Prior to changing to xyzal she was using zyrtec.  She is not sure if xyzal is more effective than zyrtec but does feel that they are about the same in efficacy.   Dr. Wilburn Cornelia has set her up to had a CT sinus done today.   She denies any major health changes, surgeries or hospitalizations since last visit.    Review of systems: Review of Systems  Constitutional: Negative for chills, fever and malaise/fatigue.  HENT: Positive for congestion. Negative for ear discharge, ear pain, nosebleeds and sore throat.   Eyes: Positive for redness. Negative for pain and discharge.  Respiratory: Negative for cough, shortness of breath and wheezing.   Cardiovascular: Negative for chest pain.  Gastrointestinal: Negative for abdominal pain, constipation, diarrhea, heartburn, nausea and vomiting.  Musculoskeletal: Negative for joint pain.  Skin: Negative for itching and rash.  Neurological: Negative for headaches.    All other systems negative unless noted above in HPI  Past medical/social/surgical/family history have been reviewed and are unchanged unless specifically indicated below.  No  changes  Medication List: Allergies as of 01/11/2018      Reactions   Azithromycin Other (See Comments)      Medication List        Accurate as of 01/11/18  6:49 PM. Always use your most recent med list.          aspirin 81 MG tablet Take 81 mg by mouth daily.   cholecalciferol 1000 units tablet Commonly known as:  VITAMIN D Take 1,000 Units by mouth daily.   cloNIDine 0.1 MG tablet Commonly known as:  CATAPRES Take 1 tablet (0.1 mg total) by mouth 2 (two) times daily.   Flaxseed Oil 1200 MG Caps Take 1 capsule by mouth 1 day or 1 dose.   FLUoxetine 20 MG tablet Commonly known as:  PROZAC Take 1 tablet (20 mg total) by mouth daily.   ipratropium 0.03 % nasal spray Commonly known as:  ATROVENT Place 2 sprays into both nostrils 3 (three) times daily.   levocetirizine 5 MG tablet Commonly known as:  XYZAL Take 1 tablet (5 mg total) by mouth every evening.   LUMIFY 0.025 % Soln Generic drug:  Brimonidine Tartrate Apply 1 drop to eye 2 (two) times daily.   magnesium gluconate 500 MG tablet Commonly known as:  MAGONATE Take 500 mg by mouth 2 (two) times daily.   montelukast 10 MG tablet Commonly known as:  SINGULAIR Take 1 tablet (10 mg total) by mouth at bedtime.   Olopatadine HCl 0.2 % Soln Place 1 drop into both eyes daily as needed.   TYLENOL SINUS  SEVERE 5-325-200 MG Tabs Generic drug:  Phenylephrine-APAP-guaiFENesin Take 1 capsule by mouth 2 (two) times daily.   valACYclovir 500 MG tablet Commonly known as:  VALTREX One po bid x 5 days   VITAMIN B 12 PO Take 500 mg by mouth 1 day or 1 dose.   vitamin C 1000 MG tablet Take 1,000 mg by mouth daily.       Known medication allergies: Allergies  Allergen Reactions  . Azithromycin Other (See Comments)     Physical examination: Blood pressure 122/80, pulse 72, resp. rate 16.  General: Alert, interactive, in no acute distress. HEENT: PERRLA, TMs pearly gray, turbinates mildly edematous  without discharge, post-pharynx non erythematous.  Made throat clearing noise several times throughout encounter Neck: Supple without lymphadenopathy. Lungs: Clear to auscultation without wheezing, rhonchi or rales. {no increased work of breathing. CV: Normal S1, S2 without murmurs. Abdomen: Nondistended, nontender. Skin: Warm and dry, without lesions or rashes. Extremities:  No clubbing, cyanosis or edema. Neuro:   Grossly intact.  Diagnositics/Labs: None today  Assessment and plan: Patient Instructions  Allergic rhinoconjunctivitis  -continue avoidance measures for grass pollen, weed pollen, mold, cockroach  -take Xyzal 5mg  daily  -for nasal congestion use Flonase 2 sprays each nostril daily as needed.  Use for 1-2 weeks at a time before stopping once symptoms improve  -for nasal drainage/post-nasal drip use Astelin 2 sprays each nostril twice a day as needed  -continue use of lumify as directed by your eye doctor for control of red eye symptoms  -allergen immunotherapy has been discussed at previous visit and remains an option if medication management is not effective for you in controlling your allergy symptoms.    Follow-up 4-6 months or sooner if needed  I appreciate the opportunity to take part in Destiny Lewis's care. Please do not hesitate to contact me with questions.  Sincerely,   Prudy Feeler, MD Allergy/Immunology [facility]

## 2018-01-12 MED FILL — AZELASTINE HCL 137 MCG/SPRA: 137 | 25 days supply | Qty: 30 | Fill #0

## 2018-01-22 DIAGNOSIS — J342 Deviated nasal septum: Secondary | ICD-10-CM | POA: Diagnosis not present

## 2018-01-22 DIAGNOSIS — J343 Hypertrophy of nasal turbinates: Secondary | ICD-10-CM | POA: Diagnosis not present

## 2018-02-01 MED FILL — LEVOCETIRIZINE 5 MG TABLET: 5 | 30 days supply | Qty: 30 | Fill #5

## 2018-02-04 MED FILL — CloNIDine HCL 0.1 MG TAB: 0.1 | 30 days supply | Qty: 60 | Fill #8

## 2018-02-15 ENCOUNTER — Other Ambulatory Visit (HOSPITAL_COMMUNITY): Payer: Self-pay | Admitting: Urgent Care

## 2018-02-15 MED ORDER — OSELTAMIVIR PHOSPHATE 75 MG PO CAPS: 75.0000 mg | ORAL_CAPSULE | Freq: Every day | ORAL | 0 refills | Status: DC

## 2018-02-18 MED FILL — FLUoxetine HCL 20 MG CAPS: 20 | 90 days supply | Qty: 90 | Fill #2

## 2018-02-19 ENCOUNTER — Other Ambulatory Visit: Payer: Self-pay | Admitting: *Deleted

## 2018-02-19 MED ORDER — LEVOCETIRIZINE DIHYDROCHLORIDE 5 MG PO TABS: 5.0000 mg | ORAL_TABLET | Freq: Every evening | ORAL | 3 refills | Status: DC

## 2018-02-21 MED FILL — VALACYCLOVIR HCL 500 MG TAB: 500 | 5 days supply | Qty: 10 | Fill #4

## 2018-02-23 MED FILL — FLUTICASONE PROP 50 MCG SPR: 50 | 30 days supply | Qty: 16 | Fill #1

## 2018-03-05 MED FILL — LEVOCETIRIZINE 5 MG TABLET: 5 | 90 days supply | Qty: 90 | Fill #0

## 2018-03-05 MED FILL — CloNIDine HCL 0.1 MG TAB: 0.1 | 30 days supply | Qty: 60 | Fill #9

## 2018-04-11 ENCOUNTER — Encounter: Payer: Self-pay | Admitting: Allergy

## 2018-04-11 ENCOUNTER — Ambulatory Visit: Payer: 59 | Admitting: Allergy

## 2018-04-11 VITALS — BP 118/74 | HR 72 | Resp 16

## 2018-04-11 DIAGNOSIS — J3089 Other allergic rhinitis: Secondary | ICD-10-CM

## 2018-04-11 DIAGNOSIS — H1013 Acute atopic conjunctivitis, bilateral: Secondary | ICD-10-CM

## 2018-04-11 MED FILL — CloNIDine HCL 0.1 MG TAB: 0.1 | 30 days supply | Qty: 60 | Fill #10

## 2018-04-11 NOTE — Progress Notes (Signed)
Follow-up Note  RE: Destiny Lewis MRN: [id number] DOB: April 06, 1960 Date of Office Visit: 04/11/2018   History of present illness: Destiny Lewis is a 58 y.o. female presenting today for follow-up of allergic rhinitis with conjunctivitis.  She was last seen in the office on January 11, 2018 by myself.  She states she has not had any major health changes, surgeries or hospitalizations since her last visit.   She states that her allergy symptoms have been doing okay.  She still does report on questioning that she has a lot of throat clearing.  She states she has not been using the Astelin however.  She also has not been using any Flonase.  She does take Xyzal daily.  Review of systems: Review of Systems  Constitutional: Negative for chills, fever and malaise/fatigue.  HENT: Positive for congestion. Negative for ear discharge, ear pain, nosebleeds, sinus pain and sore throat.   Eyes: Negative for pain, discharge and redness.  Respiratory: Negative for cough, shortness of breath and wheezing.   Cardiovascular: Negative for chest pain.  Gastrointestinal: Negative for abdominal pain, constipation, diarrhea, heartburn, nausea and vomiting.  Musculoskeletal: Negative for joint pain.  Skin: Negative for itching and rash.  Neurological: Negative for headaches.    All other systems negative unless noted above in HPI  Past medical/social/surgical/family history have been reviewed and are unchanged unless specifically indicated below.  No changes  Medication List: Allergies as of 04/11/2018      Reactions   Azithromycin Other (See Comments)      Medication List       Accurate as of April 11, 2018  4:20 PM. Always use your most recent med list.        aspirin 81 MG tablet Take 81 mg by mouth daily.   cholecalciferol 1000 units tablet Commonly known as:  VITAMIN D Take 1,000 Units by mouth daily.   cloNIDine 0.1 MG tablet Commonly known as:  CATAPRES Take 1 tablet (0.1 mg  total) by mouth 2 (two) times daily.   Flaxseed Oil 1200 MG Caps Take 1 capsule by mouth 1 day or 1 dose.   FLUoxetine 20 MG tablet Commonly known as:  PROZAC Take 1 tablet (20 mg total) by mouth daily.   fluticasone 50 MCG/ACT nasal spray Commonly known as:  FLONASE   ipratropium 0.03 % nasal spray Commonly known as:  ATROVENT Place 2 sprays into both nostrils 3 (three) times daily.   levocetirizine 5 MG tablet Commonly known as:  XYZAL Take 1 tablet (5 mg total) by mouth every evening.   LUMIFY 0.025 % Soln Generic drug:  Brimonidine Tartrate Apply 1 drop to eye 2 (two) times daily.   magnesium gluconate 500 MG tablet Commonly known as:  MAGONATE Take 500 mg by mouth 2 (two) times daily.   Olopatadine HCl 0.2 % Soln Commonly known as:  PATADAY Place 1 drop into both eyes daily as needed.   TYLENOL SINUS SEVERE 5-325-200 MG Tabs Generic drug:  Phenylephrine-APAP-guaiFENesin Take 1 capsule by mouth 2 (two) times daily.   valACYclovir 500 MG tablet Commonly known as:  VALTREX One po bid x 5 days   VITAMIN B 12 PO Take 500 mg by mouth 1 day or 1 dose.   vitamin C 1000 MG tablet Take 1,000 mg by mouth daily.       Known medication allergies: Allergies  Allergen Reactions  . Azithromycin Other (See Comments)     Physical examination: Blood pressure 118/74, pulse 72, resp.  rate 16, SpO2 96 %.  General: Alert, interactive, in no acute distress. HEENT: PERRLA, TMs pearly gray, turbinates minimally edematous with clear discharge, post-pharynx non erythematous. Neck: Supple without lymphadenopathy. Lungs: Clear to auscultation without wheezing, rhonchi or rales. {no increased work of breathing. CV: Normal S1, S2 without murmurs. Abdomen: Nondistended, nontender. Skin: Warm and dry, without lesions or rashes. Extremities:  No clubbing, cyanosis or edema. Neuro:   Grossly intact.  Diagnositics/Labs: None today  Assessment and plan:   Allergic rhinitis  with conjunctivitis  -continue avoidance measures for grass pollen, weed pollen, mold, cockroach  -continue Xyzal 5mg  daily  -for nasal congestion use Flonase 2 sprays each nostril daily as needed.  Use for 1-2 weeks at a time before stopping once symptoms improve  -for nasal drainage/post-nasal drip/throat clearing use Astelin 2 sprays each nostril once a day (can use twice a day) as needed.   If not able to tolerate this spray then let us know and will prescribe nasal atrovent.    - if she is not able to tolerate use of Astelin then will have her use nasal atrovent for control of nasal drainage  -continue use of lumify as directed by your eye doctor for control of red eye symptoms  -allergen immunotherapy has been discussed at previous visit and remains an option if medication management is not effective for you in controlling your allergy symptoms.    Follow-up 4-6 months or sooner if needed  I appreciate the opportunity to take part in Leitha's care. Please do not hesitate to contact me with questions.  Sincerely,   Prudy Feeler, MD Allergy/Immunology [facility]

## 2018-04-11 NOTE — Patient Instructions (Signed)
Allergic rhinoconjunctivitis  -continue avoidance measures for grass pollen, weed pollen, mold, cockroach  -continue Xyzal 5mg  daily  -for nasal congestion use Flonase 2 sprays each nostril daily as needed.  Use for 1-2 weeks at a time before stopping once symptoms improve  -for nasal drainage/post-nasal drip/throat clearing use Astelin 2 sprays each nostril once a day (can use twice a day) as needed.   If not able to tolerate this spray then let us know and will prescribe nasal atrovent.    -continue use of lumify as directed by your eye doctor for control of red eye symptoms  -allergen immunotherapy has been discussed at previous visit and remains an option if medication management is not effective for you in controlling your allergy symptoms.    Follow-up 4-6 months or sooner if needed

## 2018-05-02 MED FILL — FLUTICASONE PROP 50 MCG SPR: 50 | 30 days supply | Qty: 16 | Fill #2

## 2018-05-08 MED FILL — CloNIDine HCL 0.1 MG TAB: 0.1 | 30 days supply | Qty: 60 | Fill #11

## 2018-05-12 MED FILL — FLUoxetine HCL 20 MG CAPS: 20 | 90 days supply | Qty: 90 | Fill #3

## 2018-05-22 ENCOUNTER — Other Ambulatory Visit: Payer: Self-pay | Admitting: Internal Medicine

## 2018-05-22 MED FILL — LEVOCETIRIZINE 5 MG TABLET: 5 | 90 days supply | Qty: 90 | Fill #1

## 2018-05-22 MED FILL — IPRATROPIUM 0.03% SPRAY: 0.03 | 29 days supply | Qty: 30 | Fill #1

## 2018-05-23 ENCOUNTER — Other Ambulatory Visit: Payer: Self-pay

## 2018-05-23 MED ORDER — FLUOXETINE HCL 20 MG PO TABS: 20.0000 mg | ORAL_TABLET | Freq: Every day | ORAL | 0 refills | Status: DC

## 2018-05-23 MED ORDER — CLONIDINE HCL 0.1 MG PO TABS: 0.1000 mg | ORAL_TABLET | Freq: Two times a day (BID) | ORAL | 0 refills | Status: DC

## 2018-05-23 NOTE — Telephone Encounter (Signed)
Patient made a CPE appointment for the end of the month and is requesting refills.

## 2018-05-23 NOTE — Telephone Encounter (Signed)
Refill x 30 days 

## 2018-05-26 MED FILL — FLUTICASONE PROP 50 MCG SPR: 50 | 30 days supply | Qty: 16 | Fill #3

## 2018-06-01 MED FILL — CloNIDine HCL 0.1 MG TAB: 0.1 | 30 days supply | Qty: 60 | Fill #0

## 2018-06-18 ENCOUNTER — Other Ambulatory Visit: Payer: Self-pay

## 2018-06-18 ENCOUNTER — Other Ambulatory Visit: Payer: 59 | Admitting: Internal Medicine

## 2018-06-18 ENCOUNTER — Encounter: Payer: Self-pay | Admitting: Internal Medicine

## 2018-06-18 DIAGNOSIS — E7800 Pure hypercholesterolemia, unspecified: Secondary | ICD-10-CM

## 2018-06-18 DIAGNOSIS — J302 Other seasonal allergic rhinitis: Secondary | ICD-10-CM

## 2018-06-18 DIAGNOSIS — E785 Hyperlipidemia, unspecified: Secondary | ICD-10-CM

## 2018-06-18 DIAGNOSIS — Z0000 Encounter for general adult medical examination without abnormal findings: Secondary | ICD-10-CM | POA: Diagnosis not present

## 2018-06-19 ENCOUNTER — Other Ambulatory Visit: Payer: 59 | Admitting: Internal Medicine

## 2018-06-19 LAB — CBC WITH DIFFERENTIAL/PLATELET
Absolute Monocytes: 514 cells/uL (ref 200–950)
Basophils Absolute: 48 cells/uL (ref 0–200)
Basophils Relative: 1 %
Eosinophils Absolute: 101 cells/uL (ref 15–500)
Eosinophils Relative: 2.1 %
HCT: 38.7 % (ref 35.0–45.0)
Hemoglobin: 13.3 g/dL (ref 11.7–15.5)
Lymphs Abs: 2064 cells/uL (ref 850–3900)
MCH: 31.8 pg (ref 27.0–33.0)
MCHC: 34.4 g/dL (ref 32.0–36.0)
MCV: 92.6 fL (ref 80.0–100.0)
MPV: 9.3 fL (ref 7.5–12.5)
Monocytes Relative: 10.7 %
Neutro Abs: 2074 cells/uL (ref 1500–7800)
Neutrophils Relative %: 43.2 %
Platelets: 314 10*3/uL (ref 140–400)
RBC: 4.18 10*6/uL (ref 3.80–5.10)
RDW: 13.1 % (ref 11.0–15.0)
Total Lymphocyte: 43 %
WBC: 4.8 10*3/uL (ref 3.8–10.8)

## 2018-06-19 LAB — TSH: TSH: 1.9 mIU/L (ref 0.40–4.50)

## 2018-06-19 LAB — COMPLETE METABOLIC PANEL WITH GFR
AG Ratio: 1.8 (calc) (ref 1.0–2.5)
ALT: 20 U/L (ref 6–29)
AST: 23 U/L (ref 10–35)
Albumin: 4.6 g/dL (ref 3.6–5.1)
Alkaline phosphatase (APISO): 54 U/L (ref 37–153)
BUN: 13 mg/dL (ref 7–25)
CO2: 29 mmol/L (ref 20–32)
Calcium: 9.5 mg/dL (ref 8.6–10.4)
Chloride: 105 mmol/L (ref 98–110)
Creat: 0.83 mg/dL (ref 0.50–1.05)
GFR, Est African American: 90 mL/min/{1.73_m2} (ref >=60)
GFR, Est Non African American: 78 mL/min/{1.73_m2} (ref >=60)
Globulin: 2.6 g/dL (calc) (ref 1.9–3.7)
Glucose, Bld: 100 mg/dL — ABNORMAL HIGH (ref 65–99)
Potassium: 4.5 mmol/L (ref 3.5–5.3)
Sodium: 140 mmol/L (ref 135–146)
Total Bilirubin: 0.5 mg/dL (ref 0.2–1.2)
Total Protein: 7.2 g/dL (ref 6.1–8.1)

## 2018-06-19 LAB — LIPID PANEL
Cholesterol: 286 mg/dL — ABNORMAL HIGH (ref <200)
HDL: 64 mg/dL (ref >=50)
LDL Cholesterol (Calc): 194 mg/dL (calc) — ABNORMAL HIGH
Non-HDL Cholesterol (Calc): 222 mg/dL (calc) — ABNORMAL HIGH (ref <130)
Total CHOL/HDL Ratio: 4.5 (calc) (ref <5.0)
Triglycerides: 132 mg/dL (ref <150)

## 2018-06-19 LAB — VITAMIN D 25 HYDROXY (VIT D DEFICIENCY, FRACTURES): Vit D, 25-Hydroxy: 34 ng/mL (ref 30–100)

## 2018-06-21 ENCOUNTER — Other Ambulatory Visit: Payer: Self-pay

## 2018-06-21 ENCOUNTER — Ambulatory Visit (INDEPENDENT_AMBULATORY_CARE_PROVIDER_SITE_OTHER): Payer: 59 | Admitting: Internal Medicine

## 2018-06-21 VITALS — BP 108/80 | HR 66 | Temp 98.3°F | Wt 158.0 lb

## 2018-06-21 DIAGNOSIS — R232 Flushing: Secondary | ICD-10-CM | POA: Diagnosis not present

## 2018-06-21 DIAGNOSIS — N898 Other specified noninflammatory disorders of vagina: Secondary | ICD-10-CM

## 2018-06-21 DIAGNOSIS — Z0000 Encounter for general adult medical examination without abnormal findings: Secondary | ICD-10-CM

## 2018-06-21 DIAGNOSIS — E7800 Pure hypercholesterolemia, unspecified: Secondary | ICD-10-CM

## 2018-06-21 DIAGNOSIS — J302 Other seasonal allergic rhinitis: Secondary | ICD-10-CM | POA: Diagnosis not present

## 2018-06-21 DIAGNOSIS — Z803 Family history of malignant neoplasm of breast: Secondary | ICD-10-CM | POA: Diagnosis not present

## 2018-06-21 DIAGNOSIS — Z8659 Personal history of other mental and behavioral disorders: Secondary | ICD-10-CM | POA: Diagnosis not present

## 2018-06-21 DIAGNOSIS — J309 Allergic rhinitis, unspecified: Secondary | ICD-10-CM | POA: Insufficient documentation

## 2018-06-21 MED ORDER — ROSUVASTATIN CALCIUM 5 MG PO TABS: ORAL_TABLET | ORAL | 3 refills | Status: DC

## 2018-06-21 MED FILL — ROSUVASTATIN CALCIUM 5 MG T: 5 | 84 days supply | Qty: 36 | Fill #0

## 2018-06-21 NOTE — Progress Notes (Signed)
Subjective:    Patient ID: Destiny Lewis, female    DOB: 08/31/1960, 58 y.o.   MRN: [id number]  HPI 58 year old Female seen today by interactive audio and video telecommunications due to the coronavirus pandemic.  She agrees to visit in this format today.  She is seen today for health maintenance exam and evaluation of medical issues.  Interactive audio and video telecommunications were achieved between this provider and patient.  Patient identified as mirielle, byrum patient in this practice using 2 identifiers.  Patient is a Marine scientist in [employer].  Have been cut back due to the coronavirus pandemic.  She has been staying at home and doing okay.  No history of serious illnesses or accidents.  History of allergic rhinitis.  Intolerant of Zithromax causes blisters on her chest.  Patient does not smoke.  Social alcohol consumption.  History of depression treated with Prozac with good success.  Had tetanus immunization 2016 while living in [place].  Had Prevnar 13 in May 2017 and Tdap in February 2016.  Fasting labs reviewed: Vitamin D level is normal, TSH is normal, CBC is normal, C met is normal with the exception of glucose of 100. However, lipids are elevated   Review of Systems complains of vaginal dryness, concerned about possible right ovarian cyst with some vague discomfort, hot flashes     Objective:   Physical Exam Blood pressure reported by patient is 108/80, pulse 66, weight 158 pounds.  BMI 27.99 and has actually lost 7 pounds since April 2019.  She is alert and oriented in no acute distress.  Able to give a clear concise history.  Issues discussed today include family history of breast cancer in her sister.  She was told she cannot have an MRI every other year and a 3D mammogram every other year.  She would like some advice about that.  She can certainly meet with radiologist when she has her regular mammogram.  Also would like to get  GYN input about this as well.  She is having issues with hot flashes and vaginal dryness.  I would prefer not to have her on estrogen therapy with family history of breast cancer in first-degree relative.  Perhaps GYN can help with her.  She has tried clonidine in the past with some success.    Review of old records indicates she had a normal Pap smear in 2016.  In 2016 she had a tendon tear right wrist due to an accident while exercising in the gym and was treated with a long-arm cast for 10 days.  Seen by Dr. Nelva Bush, allergist November 2019.  Also saw Dr. Wilburn Cornelia, ENT physician.  He advised that she stop Singulair and use Flonase with nasal saline rinse.  Allergist prescribed Astelin nasal spray.  Allergy immunotherapy has been discussed as an option.  Patient reports she had allergy testing several years ago in [place] and thinks that she was allergic to trees as well as grass pollen.  Reports usually gets 1 or 2 sinus infections a year treated with antibiotics.  No history of asthma.  Last week she had some discomfort in her right lower quadrant.  She is concerned about possible ovarian cyst and GYN referral will be made.  She had colonoscopy in October 2019 with 1 adenoma.  Recall 2024.  No history of serious illnesses or accidents.  Social history: 2 children, a daughter and a son.  Daughter has had issues with eating disorder and depression.  Son  has had issues with anxiety.  One sister died at age 54 with metastatic breast cancer in 2010.  One sister has a history of Hashimoto's thyroiditis, vitiligo and GI problems.  One brother with history of MI in 37.  Mother died at age 80 of congestive heart failure.  Father living 41 years of age with history of COPD and macular degeneration.  Patient does not smoke.  Social alcohol consumption consisting of 8 ounces of wine daily.       Assessment & Plan:  Allergic rhinitis-continue current treatment  History of  depression-stable on Prozac  Hot flashes-to see GYN physician.  Has tried clonidine without success.  May not be a good idea to use estrogen replacement with family history of breast cancer in first-degree relative.  Vaginal dryness-to see GYN physician  Family history of breast cancer in sister-ask GYN physician regarding need for MRI of the breast  Pure hypercholesterolemia.  Total cholesterol has increased from 244 to 286.  LDL cholesterol has increased from 154 to 194.  This may be due to diet and less activity with not working full-time.  Triglycerides are normal.  Patient will try Crestor 5 mg 3 times a week and follow-up in 3 months.

## 2018-06-21 NOTE — Patient Instructions (Signed)
We will make referral to GYN physician for you to discuss menopausal symptoms and to have pelvic exam.  You may also discuss with them whether or not to have MRI of the breast with family history of breast cancer in her sister.  We are starting Crestor 5 mg 3 times a week for elevated cholesterol with follow-up here in 3 months.

## 2018-06-22 MED FILL — FLUTICASONE PROP 50 MCG SPR: 50 | 30 days supply | Qty: 16 | Fill #4

## 2018-06-26 ENCOUNTER — Other Ambulatory Visit: Payer: Self-pay

## 2018-06-27 ENCOUNTER — Encounter: Payer: Self-pay | Admitting: Obstetrics and Gynecology

## 2018-06-27 ENCOUNTER — Ambulatory Visit: Payer: 59 | Admitting: Obstetrics and Gynecology

## 2018-06-27 VITALS — BP 102/64 | HR 62 | Temp 98.5°F | Ht 62.25 in | Wt 166.8 lb

## 2018-06-27 DIAGNOSIS — N951 Menopausal and female climacteric states: Secondary | ICD-10-CM | POA: Diagnosis not present

## 2018-06-27 DIAGNOSIS — Z803 Family history of malignant neoplasm of breast: Secondary | ICD-10-CM

## 2018-06-27 DIAGNOSIS — R109 Unspecified abdominal pain: Secondary | ICD-10-CM

## 2018-06-27 DIAGNOSIS — R102 Pelvic and perineal pain: Secondary | ICD-10-CM | POA: Diagnosis not present

## 2018-06-27 DIAGNOSIS — R140 Abdominal distension (gaseous): Secondary | ICD-10-CM | POA: Diagnosis not present

## 2018-06-27 DIAGNOSIS — Z800 Family history of malignant neoplasm of digestive organs: Secondary | ICD-10-CM

## 2018-06-27 NOTE — Progress Notes (Addendum)
GYNECOLOGY  VISIT   HPI: 58 y.o.   Married White or Caucasian Not Hispanic or Latino  female   [phone] with Patient's last menstrual period was 01/26/2015 (approximate).   here as new patient consultation from Dr Renold Genta for menopause and possible ovarian cyst.  The patient c/o RLQ pain x 3 weeks on and off. The pain is dull, aching, pulling, radiating to her lower back. It can last for days at a time, at most goes away for 1-2 day. Some pain in her RUQ, sometimes comes on with eating. The pain in her RLQ is similar to prior ovarian cysts.  The pain ranges from a 2-9/10 in severity.  She has a h/o constipation, better lately. BM 5 x a week, occasionally straining (in the past has gone 4 days without a BM). She hasn't noticed any change in her pain with BM. No nausea or emesis, but she does have indigestion, feels bloated, gasey from "both ends".  She is a Therapist, sports, works in [place].  No urinary c/o. No vaginal bleeding. Sexually active, has dryness, helped with lubrication. She is having hot flashes, not as bad as they were, but predictable. Has 7-8 hot flashes a day (down from the 20's). She is having a few mild night sweats every night. Sleeping okay, better than she was.   Her last colonoscopy was in October, one polyp. Being seen every 5 years.    GYNECOLOGIC HISTORY: Patient's last menstrual period was 01/26/2015 (approximate). Contraception: PMP Menopausal hormone therapy: none Pap: last year, normal        OB History    Gravida  2   Para  2   Term  2   Preterm      AB      Living  2     SAB      TAB      Ectopic      Multiple      Live Births  2              Patient Active Problem List   Diagnosis Date Noted  . Allergic rhinitis 06/21/2018  . Pure hypercholesterolemia 06/21/2018  . Family history of breast cancer in sister 06/21/2018  . History of depression 06/21/2018  . Vaginal dryness 06/21/2018  . Family history of colon cancer in father 12/04/2017  . Hot  flashes 06/15/2017  . Hx of adenomatous colonic polyps 08/13/2015    Past Medical History:  Diagnosis Date  . Allergy   . Colon polyps   . Constipation    uses figs and that helps   . High cholesterol   . History of colon polyps   . Hormone disorder   . Hx of ovarian cyst   . Peptic ulcer     Past Surgical History:  Procedure Laterality Date  . BUNIONECTOMY Bilateral   . COLONOSCOPY    . OSTEOCHONDROMA EXCISION    . POLYPECTOMY    . UPPER GASTROINTESTINAL ENDOSCOPY    . WISDOM TOOTH EXTRACTION      Current Outpatient Medications  Medication Sig Dispense Refill  . cholecalciferol (VITAMIN D) 1000 units tablet Take 1,000 Units by mouth daily.    . cloNIDine (CATAPRES) 0.1 MG tablet Take 1 tablet (0.1 mg total) by mouth 2 (two) times daily. 60 tablet 0  . FLUoxetine (PROZAC) 20 MG tablet Take 1 tablet (20 mg total) by mouth daily. 30 tablet 0  . fluticasone (FLONASE) 50 MCG/ACT nasal spray     . levocetirizine (  XYZAL) 5 MG tablet Take 1 tablet (5 mg total) by mouth every evening. 90 tablet 3  . magnesium gluconate (MAGONATE) 500 MG tablet Take 500 mg by mouth 2 (two) times daily.    . Multiple Vitamin (MULTIVITAMIN) tablet Take 1 tablet by mouth daily.    . rosuvastatin (CRESTOR) 5 MG tablet TAKE ONE TABLET BY MOUTH THREE TIMES A WEEK. 36 tablet 3  . valACYclovir (VALTREX) 500 MG tablet One po bid x 5 days 10 tablet 11   No current facility-administered medications for this visit.      ALLERGIES: Azithromycin  Family History  Problem Relation Age of Onset  . Skin cancer Mother   . Heart attack Mother   . Hypertension Mother   . Arthritis Mother   . Diabetes Mother   . Colon cancer Father   . COPD Father   . Breast cancer Sister   . Hypertension Sister   . Hypertension Brother   . Colon polyps Brother   . Heart attack Brother   . Breast cancer Maternal Aunt   . Colon cancer Maternal Grandmother   . Colon polyps Brother   . Hashimoto's thyroiditis Sister   .  Esophageal cancer Neg Hx   . Rectal cancer Neg Hx   . Stomach cancer Neg Hx   Sister with breast cancer at 41, died at 62. Not tested for BRCA. Patient was tested for BRCA and was negative. 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 MA with breast cancer in her 23's. Dad had colon cancer at 79.  PCousin with either uterine or cervical culture.   Social History   Socioeconomic History  . Marital status: Married    Spouse name: Not on file  . Number of children: Not on file  . Years of education: Not on file  . Highest education level: Not on file  Occupational History  . Not on file  Social Needs  . Financial resource strain: Not on file  . Food insecurity:    Worry: Not on file    Inability: Not on file  . Transportation needs:    Medical: Not on file    Non-medical: Not on file  Tobacco Use  . Smoking status: Never Smoker  . Smokeless tobacco: Never Used  Substance and Sexual Activity  . Alcohol use: Yes    Alcohol/week: 6.0 - 7.0 standard drinks    Types: 6 - 7 Glasses of wine per week  . Drug use: Never  . Sexual activity: Yes    Birth control/protection: Post-menopausal, Other-see comments    Comment: vasectomy   Lifestyle  . Physical activity:    Days per week: Not on file    Minutes per session: Not on file  . Stress: Not on file  Relationships  . Social connections:    Talks on phone: Not on file    Gets together: Not on file    Attends religious service: Not on file    Active member of club or organization: Not on file    Attends meetings of clubs or organizations: Not on file    Relationship status: Not on file  . Intimate partner violence:    Fear of current or ex partner: Not on file    Emotionally abused: Not on file    Physically abused: Not on file    Forced sexual activity: Not on file  Other Topics Concern  . Not on file  Social History Narrative  . Not on  file  SH: kids are 31 and 71 (local), no grandchildren. She is a Therapist, sports in the  endoscopy unit. Husband is a retired Immunologist.  Review of Systems  Constitutional: Negative.   HENT: Negative.   Eyes: Negative.   Respiratory: Negative.   Cardiovascular: Negative.   Gastrointestinal: Positive for abdominal pain and constipation.  Genitourinary: Negative.        Vaginal dryness  Musculoskeletal: Negative.   Skin: Negative.   Neurological: Negative.   Endo/Heme/Allergies: Negative.   Psychiatric/Behavioral: Negative.   All other systems reviewed and are negative.   PHYSICAL EXAMINATION:    BP 102/64   Pulse 62   Temp 98.5 F (36.9 C) (Oral)   Ht 5' 2.25" (1.581 m)   Wt 166 lb 12.8 oz (75.7 kg)   LMP 01/26/2015 (Approximate)   BMI 30.26 kg/m     General appearance: alert, cooperative and appears stated age Heart: regular rate and rhythm Lungs: CTAB Abdomen: soft, non-tender; bowel sounds normal; no masses,  no organomegaly Extremities: normal, atraumatic, no cyanosis Skin: normal color, texture and turgor, no rashes or lesions Lymph: normal cervical supraclavicular and inguinal nodes Neurologic: grossly normal   Pelvic: External genitalia:  no lesions              Urethra:  normal appearing urethra with no masses, tenderness or lesions              Bartholins and Skenes: normal                 Vagina: normal appearing vagina with normal color and discharge, no lesions              Cervix: no cervical motion tenderness and no lesions              Bimanual Exam:  Uterus:  normal size, contour, position, consistency, mobility, non-tender              Adnexa: no mass, fullness, tenderness              Rectovaginal: Yes.  .  Confirms.              Anus:  normal sphincter tone, no lesions  Chaperone was present for exam.  ASSESSMENT RLQ abdominal/pelvic pain, no concerning findings on exam. We discussed possible etiologies of her pain Vasomotor symptoms, still significant on clonidine an Prozac FH of breast cancer, sister at 39 and MA in her 40's. The  patient had negative BRCA testing (relatives not tested) FH of colon cancer     PLAN Return for GYN ultrasound Get a copy of her genetics consultation.  For treating her vasomotor symptoms we discussed: behavioral changes/avoiding triggers, use of gabapentin and possible use of HRT. We discussed risks vs benefits of medication Once I have a better estimation of her risk of breast cancer (in the genetics report) we can further discuss HRT  We also discussed the possibility of breast MRI's, again depending on her risk for breast cancer    An After Visit Summary was printed and given to the patient.   ~40 minutes face to face time of which over 50% was spent in counseling.   Addendum: Genetics records reviewed, Harriett Rush lifetime risk of breast cancer is 24.7%, candidate for annual breast MRI. Will offer breast MRI to the patient.

## 2018-06-27 NOTE — Patient Instructions (Signed)

## 2018-07-03 ENCOUNTER — Telehealth: Payer: Self-pay | Admitting: Obstetrics and Gynecology

## 2018-07-03 NOTE — Telephone Encounter (Signed)
Please let the patient know that I received her Genetics Notes from the [facility]. They estimate her lifetime risk of breast cancer from the [place] risk assesment is 24.7%. Please tell her breast MRI's are recommended yearly if the risk is over 20% and see if she would like [place] to order a MRI for her. We also discussed options for treating her menopausal symptoms. I wouldn't say that she can't take HRT, but her risk of breast cancer could be increased even more. I would lean towards the other options we discussed.

## 2018-07-03 NOTE — Telephone Encounter (Signed)
Left message to call [name] at [phone]. 

## 2018-07-04 NOTE — Telephone Encounter (Signed)
Spoke with patient. Message given as seen below from [place]. Patient verbalizes understanding. Would like to discuss this further with Dr.Jertson at her appointment tomorrow before proceeding.  Routing to provider and will close encounter.

## 2018-07-05 ENCOUNTER — Other Ambulatory Visit: Payer: Self-pay | Admitting: Obstetrics and Gynecology

## 2018-07-05 ENCOUNTER — Other Ambulatory Visit: Payer: Self-pay

## 2018-07-05 ENCOUNTER — Encounter: Payer: Self-pay | Admitting: Obstetrics and Gynecology

## 2018-07-05 ENCOUNTER — Ambulatory Visit (INDEPENDENT_AMBULATORY_CARE_PROVIDER_SITE_OTHER): Payer: 59

## 2018-07-05 ENCOUNTER — Ambulatory Visit (INDEPENDENT_AMBULATORY_CARE_PROVIDER_SITE_OTHER): Payer: 59 | Admitting: Obstetrics and Gynecology

## 2018-07-05 ENCOUNTER — Other Ambulatory Visit: Payer: 59

## 2018-07-05 VITALS — BP 106/70 | HR 64 | Temp 98.3°F | Ht 62.25 in | Wt 165.0 lb

## 2018-07-05 DIAGNOSIS — R102 Pelvic and perineal pain: Secondary | ICD-10-CM

## 2018-07-05 DIAGNOSIS — R109 Unspecified abdominal pain: Secondary | ICD-10-CM

## 2018-07-05 DIAGNOSIS — N952 Postmenopausal atrophic vaginitis: Secondary | ICD-10-CM

## 2018-07-05 DIAGNOSIS — R6882 Decreased libido: Secondary | ICD-10-CM

## 2018-07-05 DIAGNOSIS — N951 Menopausal and female climacteric states: Secondary | ICD-10-CM

## 2018-07-05 DIAGNOSIS — Z9189 Other specified personal risk factors, not elsewhere classified: Secondary | ICD-10-CM | POA: Diagnosis not present

## 2018-07-05 DIAGNOSIS — Z1231 Encounter for screening mammogram for malignant neoplasm of breast: Secondary | ICD-10-CM

## 2018-07-05 MED ORDER — GABAPENTIN 100 MG PO CAPS: ORAL_CAPSULE | ORAL | 0 refills | Status: DC

## 2018-07-05 MED ORDER — ESTRADIOL 10 MCG VA TABS: 1.0000 | ORAL_TABLET | VAGINAL | 0 refills | Status: DC

## 2018-07-05 MED FILL — GABAPENTIN 100 MG CAPSULE: 100 | 30 days supply | Qty: 90 | Fill #0

## 2018-07-05 MED FILL — ESTRADIOL 10 MCG TABS: 10 | 84 days supply | Qty: 24 | Fill #0

## 2018-07-05 NOTE — Progress Notes (Signed)
GYNECOLOGY  VISIT   HPI: 58 y.o.   Married White or Caucasian Not Hispanic or Latino  female   [phone] with Patient's last menstrual period was 01/26/2015 (approximate).   here for  PUS & consult for vasomotor symptoms She is on clonidine currently for vasomotor symptoms. She still has at least 10-12 hot flashes a day. She is up 3 x a night with sweats. Not sleeping well. Sometimes has trouble falling back to sleep.  She has a BM 4-6 x a week. Worse with stress.  She has vaginal dryness, helped some with lubricant. No libido. No longer able to have an orgasm. She is on Prozac, hasn't done well when she tried to go off of it.   GYNECOLOGIC HISTORY: Patient's last menstrual period was 01/26/2015 (approximate). Contraception:PMP Menopausal hormone therapy: none        OB History    Gravida  2   Para  2   Term  2   Preterm      AB      Living  2     SAB      TAB      Ectopic      Multiple      Live Births  2              Patient Active Problem List   Diagnosis Date Noted  . Allergic rhinitis 06/21/2018  . Pure hypercholesterolemia 06/21/2018  . Family history of breast cancer in sister 06/21/2018  . History of depression 06/21/2018  . Vaginal dryness 06/21/2018  . Family history of colon cancer in father 12/04/2017  . Hot flashes 06/15/2017  . Hx of adenomatous colonic polyps 08/13/2015    Past Medical History:  Diagnosis Date  . Allergy   . Colon polyps   . Constipation    uses figs and that helps   . High cholesterol   . History of colon polyps   . Hormone disorder   . Hx of ovarian cyst   . Peptic ulcer     Past Surgical History:  Procedure Laterality Date  . BUNIONECTOMY Bilateral   . COLONOSCOPY    . OSTEOCHONDROMA EXCISION    . POLYPECTOMY    . UPPER GASTROINTESTINAL ENDOSCOPY    . WISDOM TOOTH EXTRACTION      Current Outpatient Medications  Medication Sig Dispense Refill  . cholecalciferol (VITAMIN D) 1000 units tablet Take 1,000  Units by mouth daily.    . cloNIDine (CATAPRES) 0.1 MG tablet Take 1 tablet (0.1 mg total) by mouth 2 (two) times daily. 60 tablet 0  . FLUoxetine (PROZAC) 20 MG tablet Take 1 tablet (20 mg total) by mouth daily. 30 tablet 0  . fluticasone (FLONASE) 50 MCG/ACT nasal spray     . levocetirizine (XYZAL) 5 MG tablet Take 1 tablet (5 mg total) by mouth every evening. 90 tablet 3  . magnesium gluconate (MAGONATE) 500 MG tablet Take 500 mg by mouth 2 (two) times daily.    . Multiple Vitamin (MULTIVITAMIN) tablet Take 1 tablet by mouth daily.    . rosuvastatin (CRESTOR) 5 MG tablet TAKE ONE TABLET BY MOUTH THREE TIMES A WEEK. 36 tablet 3  . valACYclovir (VALTREX) 500 MG tablet One po bid x 5 days 10 tablet 11  . Estradiol 10 MCG TABS vaginal tablet Place 1 tablet (10 mcg total) vaginally 2 (two) times a week. 24 tablet 0  . gabapentin (NEURONTIN) 100 MG capsule Start with one tablet po BID, increase by  one tablet at night every 3 days as tolerated to a total pm dose of 300 mg. 90 capsule 0   No current facility-administered medications for this visit.      ALLERGIES: Azithromycin  Family History  Problem Relation Age of Onset  . Skin cancer Mother   . Heart attack Mother   . Hypertension Mother   . Arthritis Mother   . Diabetes Mother   . Colon cancer Father   . COPD Father   . Breast cancer Sister   . Hypertension Sister   . Hypertension Brother   . Colon polyps Brother   . Heart attack Brother   . Breast cancer Maternal Aunt   . Colon cancer Maternal Grandmother   . Colon polyps Brother   . Hashimoto's thyroiditis Sister   . Esophageal cancer Neg Hx   . Rectal cancer Neg Hx   . Stomach cancer Neg Hx     Social History   Socioeconomic History  . Marital status: Married    Spouse name: Not on file  . Number of children: Not on file  . Years of education: Not on file  . Highest education level: Not on file  Occupational History  . Not on file  Social Needs  . Financial  resource strain: Not on file  . Food insecurity:    Worry: Not on file    Inability: Not on file  . Transportation needs:    Medical: Not on file    Non-medical: Not on file  Tobacco Use  . Smoking status: Never Smoker  . Smokeless tobacco: Never Used  Substance and Sexual Activity  . Alcohol use: Yes    Alcohol/week: 6.0 - 7.0 standard drinks    Types: 6 - 7 Glasses of wine per week  . Drug use: Never  . Sexual activity: Yes    Birth control/protection: Post-menopausal, Other-see comments    Comment: vasectomy   Lifestyle  . Physical activity:    Days per week: Not on file    Minutes per session: Not on file  . Stress: Not on file  Relationships  . Social connections:    Talks on phone: Not on file    Gets together: Not on file    Attends religious service: Not on file    Active member of club or organization: Not on file    Attends meetings of clubs or organizations: Not on file    Relationship status: Not on file  . Intimate partner violence:    Fear of current or ex partner: Not on file    Emotionally abused: Not on file    Physically abused: Not on file    Forced sexual activity: Not on file  Other Topics Concern  . Not on file  Social History Narrative  . Not on file    Review of Systems  All other systems reviewed and are negative.   PHYSICAL EXAMINATION:    BP 106/70   Pulse 64   Temp 98.3 F (36.8 C) (Oral)   Ht 5' 2.25" (1.581 m)   Wt 165 lb (74.8 kg)   LMP 01/26/2015 (Approximate)   BMI 29.94 kg/m     General appearance: alert, cooperative and appears stated age  Ultrasound: images reviewed with the patient  ASSESSMENT Abdominal/pelvic pain, normal GYN ultrasound, suspect constipation is the source of pain Increased risk of breast cancer, TC lifetime risk 24.7%, [place] 5 year risk 2.7% Decreased libido, anorgasmic. On prozac Severe vasomotor symptoms Vaginal atrophy  PLAN We discussed constipation, she has tried multiple treatments,  will f/u with her primary  Start Gabapentin for her vasomotor symptoms F/U in one month We discussed weaning off of the Clonidine once she is doing well on the Gabapentin We discussed vaginal estrogen, including the very small risks, she would like to start it (script sent) Check testosterone levels Discussed libido, information given Mammogram next month Will plan on Breast MRI in 12/20 She will continue breast self exams, yearly physician exam    An After Visit Summary was printed and given to the patient.  ~25 minutes face to face time of which over 50% was spent in counseling.   CC: Dr Renold Genta

## 2018-07-07 LAB — TESTT+TESTF+SHBG
Sex Hormone Binding: 77.3 nmol/L (ref 17.3–125.0)
Testosterone, Free: 0.9 pg/mL (ref 0.0–4.2)
Testosterone, Total, LC/MS: 9.9 ng/dL

## 2018-07-10 ENCOUNTER — Telehealth: Payer: Self-pay | Admitting: *Deleted

## 2018-07-10 DIAGNOSIS — Z803 Family history of malignant neoplasm of breast: Secondary | ICD-10-CM

## 2018-07-10 DIAGNOSIS — Z9189 Other specified personal risk factors, not elsewhere classified: Secondary | ICD-10-CM

## 2018-07-10 DIAGNOSIS — Z1231 Encounter for screening mammogram for malignant neoplasm of breast: Secondary | ICD-10-CM

## 2018-07-10 NOTE — Telephone Encounter (Signed)
-----   Message from Huey Romans, RN sent at 07/06/2018  4:04 PM EDT -----  ----- Message ----- From: Salvadore Dom, MD Sent: 07/05/2018   1:26 PM EDT To: [facility] Triage Pool  This patient has a 24.7% lifetime risk of breast cancer. She wants to start doing yearly breast MRI's. Please set her up for a breast MRI in 12/20 (mammogram is in 6/20). Thanks, Sharee Pimple

## 2018-07-10 NOTE — Telephone Encounter (Signed)
Order placed for bilateral breast MRI with and without contrast at [facility], to be scheduled 01/2019 . Call to patient to advise, patient aware MRI will be precerted by [facility] and [facility] will contact her directly to schedule. Patient verbalizes understanding and is agreeable.   Routing to [employer].   Encounter closed.

## 2018-07-11 ENCOUNTER — Telehealth: Payer: Self-pay

## 2018-07-11 NOTE — Telephone Encounter (Signed)
-----   Message from Salvadore Dom, MD sent at 07/10/2018  4:55 PM EDT ----- Please let the patient know that her testosterone levels were in the low normal range. I think that the prozac has a big impact on her libido, but if she wanted to try a low dose topical testosterone compounded cream we could.  Advise her that too much testosterone could cause female pattern baldness, hair growth in placed she doesn't want it, clitoromegaly and deepening of her voice.  If she wants to proceed please call in 1% testosterone cream, apply 0.5 gm topically, #30 grams. Needs f/u testosterone panel one month after starting.

## 2018-07-11 NOTE — Telephone Encounter (Signed)
Spoke with patient. Results given as seen below from [facility]. Patient verbalizes understanding. Would like to try testosterone cream. Rx for 1% testosterone cream, apply 0.5 gm topically, #30 grams printed and to [place] for review and signature to send to [employer]. Patient is due for a follow up with Dr.Jertson in a month and would like to do labs all at once. Will call back to schedule once she has her work scheduled.

## 2018-07-12 MED ORDER — NONFORMULARY OR COMPOUNDED ITEM: 0 refills | Status: DC

## 2018-07-12 NOTE — Addendum Note (Signed)
Addended by: Gwendlyn Deutscher on: 07/12/2018 05:47 PM   Modules accepted: Orders

## 2018-07-12 NOTE — Telephone Encounter (Signed)
Rx faxed to [facility]. Encounter closed.

## 2018-07-13 MED ORDER — NONFORMULARY OR COMPOUNDED ITEM: 0 refills | Status: DC

## 2018-07-13 NOTE — Addendum Note (Signed)
Addended by: Burnice Logan on: 07/13/2018 08:04 AM   Modules accepted: Orders

## 2018-07-13 NOTE — Telephone Encounter (Signed)
Printed Rx to covering provider, Dr. Sabra Heck, to review and sign.

## 2018-07-19 MED FILL — FLUTICASONE PROP 50 MCG SPR: 50 | 30 days supply | Qty: 16 | Fill #5

## 2018-07-26 ENCOUNTER — Other Ambulatory Visit: Payer: Self-pay | Admitting: Obstetrics and Gynecology

## 2018-07-26 NOTE — Telephone Encounter (Signed)
Patient scheduled follow up appointment for 08/30/2018. Is in need of refill on gabapentin, will be out this week.

## 2018-07-26 NOTE — Telephone Encounter (Signed)
Medication refill request: Gabapentin Last OV:  07/05/18 JJ Next OV: 08/30/18  Last MMG (if hormonal medication request): 07/27/17 BIRADS 1 negative/density c Refill authorized: Please advise if appropriate; patient is returning for a follow up appointment 08/30/18

## 2018-07-27 ENCOUNTER — Other Ambulatory Visit: Payer: Self-pay | Admitting: Obstetrics and Gynecology

## 2018-07-27 MED ORDER — GABAPENTIN 300 MG PO CAPS: 300.0000 mg | ORAL_CAPSULE | Freq: Every day | ORAL | 0 refills | Status: DC

## 2018-07-27 MED ORDER — GABAPENTIN 100 MG PO CAPS: ORAL_CAPSULE | ORAL | 0 refills | Status: DC

## 2018-07-27 MED FILL — GABAPENTIN 300 MG CAPSULE: 300 | 90 days supply | Qty: 90 | Fill #0

## 2018-07-27 NOTE — Telephone Encounter (Signed)
I sent in her script for 300 mg qhs and another script for 100 mg q am. Please inform

## 2018-07-27 NOTE — Telephone Encounter (Signed)
Patient notified

## 2018-07-27 NOTE — Telephone Encounter (Signed)
Left message for call back.

## 2018-07-27 NOTE — Telephone Encounter (Signed)
Patient is returning a call to Joy. °

## 2018-07-27 NOTE — Telephone Encounter (Addendum)
Medication refill request: Gabapentin  Last OV:  07/05/18 JJ  Next OV: 08/30/18   Last MMG (if hormonal medication request): 07/27/17 BIRADS 1 negative/density c  Refill authorized: Please advise if appropriate; patient is returning for a follow up appointment 08/30/18. Last given 90 day supply with 0 refills. Per rx it says to take p bid & then to increase to 300mg  every 3 days. rx yesterday was denied & another came back to [place]. Left message for patient to callback. Note from yesterday stated that patient was out of medication already. Per patient she is taking 1 pill every morning & 3 pills every night. Please advise.

## 2018-07-29 MED FILL — GABAPENTIN 100 MG CAPSULE: 100 | 90 days supply | Qty: 90 | Fill #0

## 2018-08-02 ENCOUNTER — Ambulatory Visit: Payer: 59 | Admitting: Obstetrics and Gynecology

## 2018-08-13 MED FILL — LEVOCETIRIZINE 5 MG TABLET: 5 | 90 days supply | Qty: 90 | Fill #2

## 2018-08-14 MED FILL — FLUTICASONE PROP 50 MCG SPR: 50 | 30 days supply | Qty: 16 | Fill #6

## 2018-08-24 MED ORDER — FLUOXETINE HCL 20 MG PO TABS: 20.0000 mg | ORAL_TABLET | Freq: Every day | ORAL | 1 refills | Status: DC

## 2018-08-24 MED FILL — FLUoxetine HCL 20 MG CAPS: 20 | 90 days supply | Qty: 90 | Fill #0

## 2018-08-24 NOTE — Telephone Encounter (Signed)
Refilled Prozac at pt request. She has follow up for hyperlipidemia later this month.

## 2018-08-27 ENCOUNTER — Other Ambulatory Visit: Payer: Self-pay

## 2018-08-27 ENCOUNTER — Ambulatory Visit
Admission: RE | Admit: 2018-08-27 | Discharge: 2018-08-27 | Disposition: A | Discharge status: Home / Self Care | Payer: 59 | Source: Ambulatory Visit | Attending: Obstetrics and Gynecology | Admitting: Obstetrics and Gynecology

## 2018-08-27 DIAGNOSIS — Z1231 Encounter for screening mammogram for malignant neoplasm of breast: Secondary | ICD-10-CM

## 2018-08-27 MED ORDER — FLUOXETINE HCL 20 MG PO TABS: 20.0000 mg | ORAL_TABLET | Freq: Every day | ORAL | 1 refills | Status: DC

## 2018-08-29 NOTE — Progress Notes (Signed)
GYNECOLOGY  VISIT   HPI: 58 y.o.   Married White or Caucasian Not Hispanic or Latino  female   [phone] with Patient's last menstrual period was 01/26/2015 (approximate).   here for Gabapentin follow up.  The patient was started on Gabapentin in 5/20 for severe vasomotor symptoms. She is taking 300 mg at night and 100 mg q am. When she is off she has 2 hot flashes a day. Her night sweats have improved, she gets overheated, up one x at night. Her hot flashes are better when she isn't working. While at work she is probably having 5-6 hot flashes a day. She stopped the clonidine.  She was also started on vaginal estrogen for atrophy, helping, no dyspareunia.  She was also started on testosterone at the end of May, no real improvement in libido or orgasms.  GYNECOLOGIC HISTORY: Patient's last menstrual period was 01/26/2015 (approximate). Contraception: Postmenopausal Menopausal hormone therapy: Estradiol vaginal tablet, testosterone cream        OB History    Gravida  2   Para  2   Term  2   Preterm      AB      Living  2     SAB      TAB      Ectopic      Multiple      Live Births  2              Patient Active Problem List   Diagnosis Date Noted  . Allergic rhinitis 06/21/2018  . Pure hypercholesterolemia 06/21/2018  . Family history of breast cancer in sister 06/21/2018  . History of depression 06/21/2018  . Vaginal dryness 06/21/2018  . Family history of colon cancer in father 12/04/2017  . Hot flashes 06/15/2017  . Hx of adenomatous colonic polyps 08/13/2015    Past Medical History:  Diagnosis Date  . Allergy   . Colon polyps   . Constipation    uses figs and that helps   . High cholesterol   . History of colon polyps   . Hormone disorder   . Hx of ovarian cyst   . Peptic ulcer     Past Surgical History:  Procedure Laterality Date  . BUNIONECTOMY Bilateral   . COLONOSCOPY    . OSTEOCHONDROMA EXCISION    . POLYPECTOMY    . UPPER  GASTROINTESTINAL ENDOSCOPY    . WISDOM TOOTH EXTRACTION      Current Outpatient Medications  Medication Sig Dispense Refill  . cholecalciferol (VITAMIN D) 1000 units tablet Take 1,000 Units by mouth daily.    . Estradiol 10 MCG TABS vaginal tablet Place 1 tablet (10 mcg total) vaginally 2 (two) times a week. 24 tablet 0  . FLUoxetine (PROZAC) 20 MG tablet Take 1 tablet (20 mg total) by mouth daily. NEEDS CAPSULES INSTEAD OF TABLETS DUE TO PRICE. 90 tablet 1  . fluticasone (FLONASE) 50 MCG/ACT nasal spray     . gabapentin (NEURONTIN) 100 MG capsule Take one tablet po qam 90 capsule 0  . gabapentin (NEURONTIN) 300 MG capsule Take 1 capsule (300 mg total) by mouth at bedtime. 90 capsule 0  . levocetirizine (XYZAL) 5 MG tablet Take 1 tablet (5 mg total) by mouth every evening. 90 tablet 3  . magnesium gluconate (MAGONATE) 500 MG tablet Take 500 mg by mouth 2 (two) times daily.    . Multiple Vitamin (MULTIVITAMIN) tablet Take 1 tablet by mouth daily.    . NONFORMULARY OR COMPOUNDED  ITEM 1% testosterone cream, apply 0.5 gm topically to inner thigh or lower abdomen once daily, #30 grams 0RF 30 each 0  . rosuvastatin (CRESTOR) 5 MG tablet TAKE ONE TABLET BY MOUTH THREE TIMES A WEEK. 36 tablet 3  . valACYclovir (VALTREX) 500 MG tablet One po bid x 5 days 10 tablet 11   No current facility-administered medications for this visit.      ALLERGIES: Azithromycin  Family History  Problem Relation Age of Onset  . Skin cancer Mother   . Heart attack Mother   . Hypertension Mother   . Arthritis Mother   . Diabetes Mother   . Colon cancer Father   . COPD Father   . Breast cancer Sister   . Hypertension Sister   . Hypertension Brother   . Colon polyps Brother   . Heart attack Brother   . Breast cancer Maternal Aunt   . Colon cancer Maternal Grandmother   . Colon polyps Brother   . Hashimoto's thyroiditis Sister   . Esophageal cancer Neg Hx   . Rectal cancer Neg Hx   . Stomach cancer Neg Hx      Social History   Socioeconomic History  . Marital status: Married    Spouse name: Not on file  . Number of children: Not on file  . Years of education: Not on file  . Highest education level: Not on file  Occupational History  . Not on file  Social Needs  . Financial resource strain: Not on file  . Food insecurity    Worry: Not on file    Inability: Not on file  . Transportation needs    Medical: Not on file    Non-medical: Not on file  Tobacco Use  . Smoking status: Never Smoker  . Smokeless tobacco: Never Used  Substance and Sexual Activity  . Alcohol use: Yes    Alcohol/week: 6.0 standard drinks    Types: 6 Glasses of wine per week  . Drug use: Never  . Sexual activity: Yes    Birth control/protection: Post-menopausal, Other-see comments    Comment: vasectomy   Lifestyle  . Physical activity    Days per week: Not on file    Minutes per session: Not on file  . Stress: Not on file  Relationships  . Social Herbalist on phone: Not on file    Gets together: Not on file    Attends religious service: Not on file    Active member of club or organization: Not on file    Attends meetings of clubs or organizations: Not on file    Relationship status: Not on file  . Intimate partner violence    Fear of current or ex partner: Not on file    Emotionally abused: Not on file    Physically abused: Not on file    Forced sexual activity: Not on file  Other Topics Concern  . Not on file  Social History Narrative  . Not on file    Review of Systems  Constitutional:       Hot flshes  HENT: Negative.   Eyes: Negative.   Respiratory: Negative.   Cardiovascular: Negative.   Gastrointestinal: Negative.   Genitourinary: Negative.   Musculoskeletal: Negative.   Skin: Negative.   Neurological: Negative.   Endo/Heme/Allergies: Negative.   Psychiatric/Behavioral: Negative.     PHYSICAL EXAMINATION:    BP 110/76 (BP Location: Right Arm, Patient Position:  Sitting, Cuff Size: Normal)  Pulse 72   Temp 97.9 F (36.6 C) (Skin)   Wt 164 lb 9.6 oz (74.7 kg)   LMP 01/26/2015 (Approximate)   BMI 29.86 kg/m     General appearance: alert, cooperative and appears stated age  ASSESSMENT Vasomotor symptoms, improving on Gabapentin, still having moderate hot flashes Vaginal atrophy symptoms improved with vaginal estrogen Started testosterone for decreased libido, no real change    PLAN Continue 300 mg of gabapentin at bedtime. Will increase her am gabapentin to 200 mg and can add 100 mg in the afternoon.  Continue vaginal estrogen Will check testosterone levels if in a normal range will recommend she stop her testosterone. I suspect her Prozac is causing issues with her libido    An After Visit Summary was printed and given to the patient.  ~15 minutes face to face time of which over 50% was spent in counseling.   CC: Dr Renold Genta

## 2018-08-30 ENCOUNTER — Other Ambulatory Visit: Payer: Self-pay

## 2018-08-30 ENCOUNTER — Encounter: Payer: Self-pay | Admitting: Obstetrics and Gynecology

## 2018-08-30 ENCOUNTER — Ambulatory Visit: Payer: 59 | Admitting: Obstetrics and Gynecology

## 2018-08-30 VITALS — BP 110/76 | HR 72 | Temp 97.9°F | Wt 164.6 lb

## 2018-08-30 DIAGNOSIS — R6882 Decreased libido: Secondary | ICD-10-CM

## 2018-08-30 DIAGNOSIS — N952 Postmenopausal atrophic vaginitis: Secondary | ICD-10-CM

## 2018-08-30 DIAGNOSIS — N951 Menopausal and female climacteric states: Secondary | ICD-10-CM | POA: Diagnosis not present

## 2018-08-30 MED ORDER — GABAPENTIN 100 MG PO CAPS: ORAL_CAPSULE | ORAL | 2 refills | Status: DC

## 2018-08-30 MED ORDER — GABAPENTIN 300 MG PO CAPS: 300.0000 mg | ORAL_CAPSULE | Freq: Every day | ORAL | 2 refills | Status: DC

## 2018-08-30 MED ORDER — ESTRADIOL 10 MCG VA TABS: 1.0000 | ORAL_TABLET | VAGINAL | 2 refills | Status: DC

## 2018-09-03 LAB — TESTT+TESTF+SHBG
Sex Hormone Binding: 67.8 nmol/L (ref 17.3–125.0)
Testosterone, Free: 2.8 pg/mL (ref 0.0–4.2)
Testosterone, Total, LC/MS: 170.9 ng/dL

## 2018-09-18 ENCOUNTER — Other Ambulatory Visit: Payer: Self-pay

## 2018-09-18 ENCOUNTER — Other Ambulatory Visit: Payer: 59 | Admitting: Internal Medicine

## 2018-09-18 DIAGNOSIS — E7800 Pure hypercholesterolemia, unspecified: Secondary | ICD-10-CM

## 2018-09-19 LAB — LIPID PANEL
Cholesterol: 249 mg/dL — ABNORMAL HIGH (ref <200)
HDL: 69 mg/dL (ref >=50)
LDL Cholesterol (Calc): 161 mg/dL (calc) — ABNORMAL HIGH
Non-HDL Cholesterol (Calc): 180 mg/dL (calc) — ABNORMAL HIGH (ref <130)
Total CHOL/HDL Ratio: 3.6 (calc) (ref <5.0)
Triglycerides: 83 mg/dL (ref <150)

## 2018-09-19 LAB — HEPATIC FUNCTION PANEL
AG Ratio: 1.6 (calc) (ref 1.0–2.5)
ALT: 19 U/L (ref 6–29)
AST: 34 U/L (ref 10–35)
Albumin: 4.5 g/dL (ref 3.6–5.1)
Alkaline phosphatase (APISO): 49 U/L (ref 37–153)
Bilirubin, Direct: 0.1 mg/dL (ref 0.0–0.2)
Globulin: 2.9 g/dL (calc) (ref 1.9–3.7)
Indirect Bilirubin: 0.3 mg/dL (calc) (ref 0.2–1.2)
Total Bilirubin: 0.4 mg/dL (ref 0.2–1.2)
Total Protein: 7.4 g/dL (ref 6.1–8.1)

## 2018-09-20 ENCOUNTER — Other Ambulatory Visit: Payer: Self-pay

## 2018-09-20 ENCOUNTER — Encounter: Payer: Self-pay | Admitting: Internal Medicine

## 2018-09-20 ENCOUNTER — Ambulatory Visit (INDEPENDENT_AMBULATORY_CARE_PROVIDER_SITE_OTHER): Payer: 59 | Admitting: Internal Medicine

## 2018-09-20 VITALS — BP 100/70 | HR 72 | Temp 98.3°F | Ht 62.0 in | Wt 163.0 lb

## 2018-09-20 DIAGNOSIS — E7800 Pure hypercholesterolemia, unspecified: Secondary | ICD-10-CM

## 2018-09-20 MED ORDER — ROSUVASTATIN CALCIUM 5 MG PO TABS: 5.0000 mg | ORAL_TABLET | Freq: Every day | ORAL | 1 refills | Status: DC

## 2018-09-20 MED FILL — ROSUVASTATIN CALCIUM 5 MG T: 5 | 90 days supply | Qty: 90 | Fill #0

## 2018-09-20 NOTE — Progress Notes (Signed)
   Subjective:    Patient ID: Destiny Lewis, female    DOB: 12-20-1960, 58 y.o.   MRN: [id number]  HPI  58 year old Female for follow up of hyperlipidemia.  At last visit was started on Crestor for hyperlipidemia at a dose of 5 mg 3 times a week.  Total cholesterol has improved from 286 to 249.  LDL cholesterol has improved from 194 to 161.  Liver functions are normal.  No complaints on the medication.    Review of Systems see above Testing GYN physician who started her on gabapentin for vasomotor symptoms.  Clonidine did not help much.  Started on testosterone cream by GYN for decreased libido.   GYN thinks constipation is causing abd pain Objective:   Physical Exam Blood pressure 100/70, pulse 72, temperature 98.3 degrees orally weight 163 pounds.  BMI 29.81.  Has actually gained 5 pounds since April.  No complaints and tolerating Crestor 3 times weekly     Assessment & Plan:  Pure hypercholesterolemia-some improvement with 3 times weekly Crestor.  Increase to 5 mg daily.  Follow-up in late October.  Encourage diet and exercise.  Vasomotor symptoms currently being treated with gabapentin  Decreased Libido being traeted with testosterone cream  Plan: Crestor 5 mg daily and follow up late October

## 2018-10-08 NOTE — Patient Instructions (Signed)
Increase Crestor to 5 mg daily and follow up late October

## 2018-11-06 MED FILL — LEVOCETIRIZINE 5 MG TABLET: 5 | 90 days supply | Qty: 90 | Fill #3

## 2018-11-16 MED FILL — FLUoxetine HCL 20 MG CAPS: 20 | 90 days supply | Qty: 90 | Fill #1

## 2018-11-17 MED ORDER — VALACYCLOVIR HCL 500 MG PO TABS: ORAL_TABLET | ORAL | 11 refills | Status: DC

## 2018-11-17 MED FILL — VALACYCLOVIR HCL 500 MG TAB: 500 | 5 days supply | Qty: 10 | Fill #0

## 2018-11-17 NOTE — Telephone Encounter (Signed)
Refilled

## 2018-11-21 MED FILL — VALACYCLOVIR HCL 500 MG TAB: 500 | 5 days supply | Qty: 10 | Fill #1

## 2018-12-03 ENCOUNTER — Ambulatory Visit (INDEPENDENT_AMBULATORY_CARE_PROVIDER_SITE_OTHER): Payer: 59

## 2018-12-03 ENCOUNTER — Ambulatory Visit (HOSPITAL_COMMUNITY)
Admission: EM | Admit: 2018-12-03 | Discharge: 2018-12-03 | Disposition: A | Discharge status: Home / Self Care | Payer: 59 | Attending: Family Medicine | Admitting: Family Medicine

## 2018-12-03 ENCOUNTER — Encounter (HOSPITAL_COMMUNITY): Payer: Self-pay

## 2018-12-03 ENCOUNTER — Other Ambulatory Visit: Payer: Self-pay

## 2018-12-03 ENCOUNTER — Ambulatory Visit (HOSPITAL_COMMUNITY): Payer: 59

## 2018-12-03 DIAGNOSIS — S62617A Displaced fracture of proximal phalanx of left little finger, initial encounter for closed fracture: Secondary | ICD-10-CM | POA: Diagnosis not present

## 2018-12-03 DIAGNOSIS — S62637A Displaced fracture of distal phalanx of left little finger, initial encounter for closed fracture: Secondary | ICD-10-CM

## 2018-12-03 DIAGNOSIS — W230XXA Caught, crushed, jammed, or pinched between moving objects, initial encounter: Secondary | ICD-10-CM

## 2018-12-03 NOTE — Discharge Instructions (Signed)
You may continue using over the counter ibuprofen as needed.

## 2018-12-03 NOTE — ED Provider Notes (Signed)
[facility]   [id number] 12/03/18 Arrival Time: [id number]  ASSESSMENT & PLAN:  1. Displaced fracture of distal phalanx of left little finger, initial encounter for closed fracture     I have personally viewed the imaging studies ordered this visit. Distal dorsal slightly displaced fracture.  Stack splint applied.   Discharge Instructions     You may continue using over the counter ibuprofen as needed.       Recommend: Follow-up Information    Schedule an appointment as soon as possible for a visit  with Roseanne Kaufman, MD.   Specialty: Orthopedic Surgery Contact information: [address] [place] [address] [phone]           Reviewed expectations re: course of current medical issues. Questions answered. Outlined signs and symptoms indicating need for more acute intervention. Patient verbalized understanding. After Visit Summary given.  SUBJECTIVE: History from: patient. Destiny Lewis is a 58 y.o. female who reports persistent moderate pain of her left distal 5th figner; described as aching; without radiation. Onset: abrupt. First noted: 11/30/2018. Injury/trama: yes, reports putting up a yard tent; L 5th finger caught/crushed within folding mechanism; immediate pain; later noted some mild swelling. Describes questionable decreased ROM but with soreness. Wrapped and splinted finger until today. Symptoms have progressed to a point and plateaued since beginning. Aggravating factors: certain movements. Alleviating factors: rest. Associated symptoms: none reported. Extremity sensation changes or weakness: none. Self treatment: tried OTCs with good pain relief. History of similar: no.  Past Surgical History:  Procedure Laterality Date  . BUNIONECTOMY Bilateral   . COLONOSCOPY    . OSTEOCHONDROMA EXCISION    . POLYPECTOMY    . UPPER GASTROINTESTINAL ENDOSCOPY    . WISDOM TOOTH EXTRACTION       ROS: As per HPI. All other  systems negative.    OBJECTIVE:  Vitals:   12/03/18 0948  BP: 131/81  Pulse: 78  Resp: 16  Temp: 98.1 F (36.7 C)  TempSrc: Temporal  SpO2: 99%    General appearance: alert; no distress HEENT: [place]; AT Neck: supple with FROM Resp: unlabored respirations Extremities: . LUE: warm with well perfused appearance; fairly well localized moderate tenderness over left distal 5th finger at DIP; without gross deformities; swelling: minimal; bruising: present over dorsal DIP; ROM: has difficulty actively extending L 5th finger at DIP CV: brisk extremity capillary perfusion of L 5th finger Skin: warm and dry; no visible rashes Neurologic: gait normal; normal sensation of LUE; normal strength of LUE Psychological: alert and cooperative; normal mood and affect  Imaging: Dg Finger Little Left  Result Date: 12/03/2018 CLINICAL DATA:  Left fifth finger pain after crush injury 3 days ago. EXAM: LEFT LITTLE FINGER 2+V COMPARISON:  None. FINDINGS: Mildly displaced fracture is seen involving the dorsal aspect of the proximal base of the fifth distal phalanx. No other bony abnormality is noted. No soft tissue abnormality is noted. IMPRESSION: Mildly displaced fracture seen involving dorsal aspect of proximal base of fifth distal phalanx. Electronically Signed   By: Marijo Conception M.D.   On: 12/03/2018 10:19      Allergies  Allergen Reactions  . Azithromycin Other (See Comments)    Blisters     Past Medical History:  Diagnosis Date  . Allergy   . Colon polyps   . Constipation    uses figs and that helps   . High cholesterol   . History of colon polyps   . Hormone disorder   . Hx  of ovarian cyst   . Peptic ulcer    Social History   Socioeconomic History  . Marital status: Married    Spouse name: Not on file  . Number of children: Not on file  . Years of education: Not on file  . Highest education level: Not on file  Occupational History  . Not on file  Social Needs  . Financial  resource strain: Not on file  . Food insecurity    Worry: Not on file    Inability: Not on file  . Transportation needs    Medical: Not on file    Non-medical: Not on file  Tobacco Use  . Smoking status: Never Smoker  . Smokeless tobacco: Never Used  Substance and Sexual Activity  . Alcohol use: Yes    Alcohol/week: 6.0 standard drinks    Types: 6 Glasses of wine per week  . Drug use: Never  . Sexual activity: Yes    Birth control/protection: Post-menopausal, Other-see comments    Comment: vasectomy   Lifestyle  . Physical activity    Days per week: Not on file    Minutes per session: Not on file  . Stress: Not on file  Relationships  . Social Herbalist on phone: Not on file    Gets together: Not on file    Attends religious service: Not on file    Active member of club or organization: Not on file    Attends meetings of clubs or organizations: Not on file    Relationship status: Not on file  Other Topics Concern  . Not on file  Social History Narrative  . Not on file   Family History  Problem Relation Age of Onset  . Skin cancer Mother   . Heart attack Mother   . Hypertension Mother   . Arthritis Mother   . Diabetes Mother   . Colon cancer Father   . COPD Father   . Breast cancer Sister   . Hypertension Sister   . Hypertension Brother   . Colon polyps Brother   . Heart attack Brother   . Breast cancer Maternal Aunt   . Colon cancer Maternal Grandmother   . Colon polyps Brother   . Hashimoto's thyroiditis Sister   . Esophageal cancer Neg Hx   . Rectal cancer Neg Hx   . Stomach cancer Neg Hx    Past Surgical History:  Procedure Laterality Date  . BUNIONECTOMY Bilateral   . COLONOSCOPY    . OSTEOCHONDROMA EXCISION    . POLYPECTOMY    . UPPER GASTROINTESTINAL ENDOSCOPY    . WISDOM TOOTH EXTRACTION        Vanessa Kick, MD 12/03/18 1037

## 2018-12-03 NOTE — ED Triage Notes (Signed)
Pt presents to UC stating she injured her left pinky finger 2 days ago.

## 2018-12-05 DIAGNOSIS — M25642 Stiffness of left hand, not elsewhere classified: Secondary | ICD-10-CM | POA: Diagnosis not present

## 2018-12-05 DIAGNOSIS — S62637A Displaced fracture of distal phalanx of left little finger, initial encounter for closed fracture: Secondary | ICD-10-CM | POA: Diagnosis not present

## 2018-12-05 DIAGNOSIS — M79645 Pain in left finger(s): Secondary | ICD-10-CM | POA: Diagnosis not present

## 2018-12-14 DIAGNOSIS — S62637D Displaced fracture of distal phalanx of left little finger, subsequent encounter for fracture with routine healing: Secondary | ICD-10-CM | POA: Diagnosis not present

## 2018-12-20 DIAGNOSIS — E7800 Pure hypercholesterolemia, unspecified: Secondary | ICD-10-CM | POA: Diagnosis not present

## 2018-12-20 DIAGNOSIS — J0100 Acute maxillary sinusitis, unspecified: Secondary | ICD-10-CM | POA: Diagnosis not present

## 2018-12-20 DIAGNOSIS — Z9189 Other specified personal risk factors, not elsewhere classified: Secondary | ICD-10-CM | POA: Diagnosis not present

## 2018-12-20 MED FILL — AMOX-CLAV 875-125 MG TABLET: 875-125 | 7 days supply | Qty: 14 | Fill #0

## 2018-12-21 ENCOUNTER — Other Ambulatory Visit: Payer: 59 | Admitting: Internal Medicine

## 2019-01-07 ENCOUNTER — Telehealth: Payer: Self-pay | Admitting: *Deleted

## 2019-01-07 ENCOUNTER — Ambulatory Visit
Admission: RE | Admit: 2019-01-07 | Discharge: 2019-01-07 | Disposition: A | Discharge status: Home / Self Care | Payer: 59 | Source: Ambulatory Visit | Attending: Obstetrics and Gynecology | Admitting: Obstetrics and Gynecology

## 2019-01-07 ENCOUNTER — Other Ambulatory Visit: Payer: Self-pay

## 2019-01-07 DIAGNOSIS — Z803 Family history of malignant neoplasm of breast: Secondary | ICD-10-CM

## 2019-01-07 DIAGNOSIS — N6320 Unspecified lump in the left breast, unspecified quadrant: Secondary | ICD-10-CM | POA: Diagnosis not present

## 2019-01-07 DIAGNOSIS — Z9189 Other specified personal risk factors, not elsewhere classified: Secondary | ICD-10-CM

## 2019-01-07 DIAGNOSIS — N6310 Unspecified lump in the right breast, unspecified quadrant: Secondary | ICD-10-CM | POA: Diagnosis not present

## 2019-01-07 DIAGNOSIS — Z1231 Encounter for screening mammogram for malignant neoplasm of breast: Secondary | ICD-10-CM

## 2019-01-07 MED ORDER — GADOBUTROL 1 MMOL/ML IV SOLN
7.0000 mL | Freq: Once | INTRAVENOUS | Status: AC | PRN
  Administered 2019-01-07: 7 mL via INTRAVENOUS

## 2019-01-07 NOTE — Telephone Encounter (Signed)
Juliann Pulse calling from [facility]. Request Dr. Talbert Nan review breast MRI results dated 01/07/19 and notify patient. Orders will need to be placed.   Routing to Dr. Talbert Nan to review.

## 2019-01-08 NOTE — Telephone Encounter (Signed)
Results Viewed by Emilio Math on 01/07/2019 5:44 PM  Orders placed for left and right breast ultrasound.  Orders placed for left and right breast US guided breast biopsies, if needed.   [facility] notified for scheduling.   Patient placed in MMG hold.   Routing to provider for final review.  Will close encounter.

## 2019-01-08 NOTE — Telephone Encounter (Signed)
-----   Message from Salvadore Dom, MD sent at 01/07/2019  5:18 PM EST ----- Results and any recommendations were sent via [facility]. Please place the orders.    Hi Destiny Lewis, Here is your MRI report. As per our conversation, you will be contacted to schedule the second look breast ultrasounds. Please call with any questions. Sumner Boast

## 2019-01-10 ENCOUNTER — Other Ambulatory Visit: Payer: Self-pay

## 2019-01-10 ENCOUNTER — Ambulatory Visit
Admission: RE | Admit: 2019-01-10 | Discharge: 2019-01-10 | Disposition: A | Discharge status: Home / Self Care | Payer: 59 | Source: Ambulatory Visit | Attending: Obstetrics and Gynecology | Admitting: Obstetrics and Gynecology

## 2019-01-10 ENCOUNTER — Other Ambulatory Visit: Payer: Self-pay | Admitting: Obstetrics and Gynecology

## 2019-01-10 DIAGNOSIS — N6320 Unspecified lump in the left breast, unspecified quadrant: Secondary | ICD-10-CM

## 2019-01-10 DIAGNOSIS — Z803 Family history of malignant neoplasm of breast: Secondary | ICD-10-CM

## 2019-01-10 DIAGNOSIS — Z9189 Other specified personal risk factors, not elsewhere classified: Secondary | ICD-10-CM

## 2019-01-10 DIAGNOSIS — N6041 Mammary duct ectasia of right breast: Secondary | ICD-10-CM | POA: Diagnosis not present

## 2019-01-10 DIAGNOSIS — N6310 Unspecified lump in the right breast, unspecified quadrant: Secondary | ICD-10-CM

## 2019-01-10 DIAGNOSIS — N6325 Unspecified lump in the left breast, overlapping quadrants: Secondary | ICD-10-CM | POA: Diagnosis not present

## 2019-01-10 DIAGNOSIS — N6042 Mammary duct ectasia of left breast: Secondary | ICD-10-CM | POA: Diagnosis not present

## 2019-01-10 DIAGNOSIS — N6321 Unspecified lump in the left breast, upper outer quadrant: Secondary | ICD-10-CM | POA: Diagnosis not present

## 2019-01-10 DIAGNOSIS — N6323 Unspecified lump in the left breast, lower outer quadrant: Secondary | ICD-10-CM | POA: Diagnosis not present

## 2019-01-10 DIAGNOSIS — N6313 Unspecified lump in the right breast, lower outer quadrant: Secondary | ICD-10-CM | POA: Diagnosis not present

## 2019-01-11 ENCOUNTER — Other Ambulatory Visit: Payer: Self-pay | Admitting: Obstetrics and Gynecology

## 2019-01-11 DIAGNOSIS — S62637D Displaced fracture of distal phalanx of left little finger, subsequent encounter for fracture with routine healing: Secondary | ICD-10-CM | POA: Diagnosis not present

## 2019-01-11 DIAGNOSIS — M79645 Pain in left finger(s): Secondary | ICD-10-CM | POA: Diagnosis not present

## 2019-01-11 DIAGNOSIS — M25642 Stiffness of left hand, not elsewhere classified: Secondary | ICD-10-CM | POA: Diagnosis not present

## 2019-01-11 DIAGNOSIS — S62637A Displaced fracture of distal phalanx of left little finger, initial encounter for closed fracture: Secondary | ICD-10-CM | POA: Diagnosis not present

## 2019-01-11 DIAGNOSIS — R9389 Abnormal findings on diagnostic imaging of other specified body structures: Secondary | ICD-10-CM

## 2019-01-25 MED FILL — FLUTICASONE PROP 50 MCG SPR: 50 | 60 days supply | Qty: 16 | Fill #0

## 2019-02-04 DIAGNOSIS — H25013 Cortical age-related cataract, bilateral: Secondary | ICD-10-CM | POA: Diagnosis not present

## 2019-02-04 DIAGNOSIS — H04123 Dry eye syndrome of bilateral lacrimal glands: Secondary | ICD-10-CM | POA: Diagnosis not present

## 2019-02-04 DIAGNOSIS — H2513 Age-related nuclear cataract, bilateral: Secondary | ICD-10-CM | POA: Diagnosis not present

## 2019-02-04 DIAGNOSIS — H40013 Open angle with borderline findings, low risk, bilateral: Secondary | ICD-10-CM | POA: Diagnosis not present

## 2019-02-04 MED FILL — LOTEPREDNOL ETABONATE 0.5 %: 0.5 | 50 days supply | Qty: 20 | Fill #0

## 2019-02-04 MED FILL — RESTASIS 0.05% EYE EMULSION: 0.05 | 90 days supply | Qty: 180 | Fill #0

## 2019-02-05 DIAGNOSIS — S62637D Displaced fracture of distal phalanx of left little finger, subsequent encounter for fracture with routine healing: Secondary | ICD-10-CM | POA: Diagnosis not present

## 2019-02-05 DIAGNOSIS — M25642 Stiffness of left hand, not elsewhere classified: Secondary | ICD-10-CM | POA: Diagnosis not present

## 2019-02-05 DIAGNOSIS — S62637A Displaced fracture of distal phalanx of left little finger, initial encounter for closed fracture: Secondary | ICD-10-CM | POA: Diagnosis not present

## 2019-02-05 DIAGNOSIS — M79645 Pain in left finger(s): Secondary | ICD-10-CM | POA: Diagnosis not present

## 2019-02-05 DIAGNOSIS — Y92833 Campsite as the place of occurrence of the external cause: Secondary | ICD-10-CM | POA: Diagnosis not present

## 2019-02-12 ENCOUNTER — Ambulatory Visit
Admission: RE | Admit: 2019-02-12 | Discharge: 2019-02-12 | Disposition: A | Discharge status: Home / Self Care | Payer: 59 | Source: Ambulatory Visit | Attending: Obstetrics and Gynecology | Admitting: Obstetrics and Gynecology

## 2019-02-12 ENCOUNTER — Other Ambulatory Visit: Payer: Self-pay

## 2019-02-12 ENCOUNTER — Other Ambulatory Visit (HOSPITAL_COMMUNITY): Payer: Self-pay | Admitting: Diagnostic Radiology

## 2019-02-12 DIAGNOSIS — R9389 Abnormal findings on diagnostic imaging of other specified body structures: Secondary | ICD-10-CM

## 2019-02-12 DIAGNOSIS — N6011 Diffuse cystic mastopathy of right breast: Secondary | ICD-10-CM | POA: Diagnosis not present

## 2019-02-12 DIAGNOSIS — R928 Other abnormal and inconclusive findings on diagnostic imaging of breast: Secondary | ICD-10-CM | POA: Diagnosis not present

## 2019-02-12 MED ORDER — GADOBUTROL 1 MMOL/ML IV SOLN
7.0000 mL | Freq: Once | INTRAVENOUS | Status: AC | PRN
  Administered 2019-02-12: 7 mL via INTRAVENOUS

## 2019-02-14 MED FILL — LEVOCETIRIZINE 5 MG TABLET: 5 | 90 days supply | Qty: 90 | Fill #0

## 2019-02-14 MED FILL — FLUoxetine HCL 20 MG CAPS: 20 | 90 days supply | Qty: 90 | Fill #0

## 2019-02-25 ENCOUNTER — Other Ambulatory Visit: Payer: Self-pay | Admitting: *Deleted

## 2019-02-25 DIAGNOSIS — Z803 Family history of malignant neoplasm of breast: Secondary | ICD-10-CM

## 2019-02-25 DIAGNOSIS — Z9189 Other specified personal risk factors, not elsewhere classified: Secondary | ICD-10-CM

## 2019-02-25 DIAGNOSIS — Z1239 Encounter for other screening for malignant neoplasm of breast: Secondary | ICD-10-CM

## 2019-03-19 DIAGNOSIS — H16223 Keratoconjunctivitis sicca, not specified as Sjogren's, bilateral: Secondary | ICD-10-CM | POA: Diagnosis not present

## 2019-03-19 DIAGNOSIS — H04123 Dry eye syndrome of bilateral lacrimal glands: Secondary | ICD-10-CM | POA: Diagnosis not present

## 2019-03-22 MED FILL — FLUTICASONE PROP 50 MCG SPR: 50 | 60 days supply | Qty: 16 | Fill #1

## 2019-03-25 DIAGNOSIS — L821 Other seborrheic keratosis: Secondary | ICD-10-CM | POA: Diagnosis not present

## 2019-03-25 DIAGNOSIS — D225 Melanocytic nevi of trunk: Secondary | ICD-10-CM | POA: Diagnosis not present

## 2019-03-25 DIAGNOSIS — D1801 Hemangioma of skin and subcutaneous tissue: Secondary | ICD-10-CM | POA: Diagnosis not present

## 2019-03-25 DIAGNOSIS — L718 Other rosacea: Secondary | ICD-10-CM | POA: Diagnosis not present

## 2019-03-25 DIAGNOSIS — L814 Other melanin hyperpigmentation: Secondary | ICD-10-CM | POA: Diagnosis not present

## 2019-05-11 MED FILL — FLUoxetine HCL 20 MG CAPS: 20 | 90 days supply | Qty: 90 | Fill #1

## 2019-05-11 MED FILL — LEVOCETIRIZINE 5 MG TABLET: 5 | 90 days supply | Qty: 90 | Fill #1

## 2019-05-28 DIAGNOSIS — R1011 Right upper quadrant pain: Secondary | ICD-10-CM | POA: Diagnosis not present

## 2019-05-30 DIAGNOSIS — N200 Calculus of kidney: Secondary | ICD-10-CM | POA: Diagnosis not present

## 2019-05-30 DIAGNOSIS — R1011 Right upper quadrant pain: Secondary | ICD-10-CM | POA: Diagnosis not present

## 2019-05-31 ENCOUNTER — Telehealth: Payer: Self-pay | Admitting: Internal Medicine

## 2019-05-31 MED FILL — RESTASIS 0.05% EYE EMULSION: 0.05 | 90 days supply | Qty: 180 | Fill #1

## 2019-05-31 MED FILL — FLUTICASONE PROP 50 MCG SPR: 50 | 60 days supply | Qty: 16 | Fill #2

## 2019-05-31 NOTE — Telephone Encounter (Signed)
In person conversation, Destiny Lewis has had intermittent right upper quadrant and right lower quadrant pain and bloating problems for years.  She is actually moving her bowels well now on a regimen of probiotics and higher fiber in her diet.  She saw Destiny Mons, PA-C recently and had CT abdomen and pelvis and right upper quadrant ultrasound which were negative.  I reviewed those reports.  The pain does not sound musculoskeletal not with movement its been there off and on for years she would just like to know what it is and also get some relief.  More recently she ate a Easter did not overeat but felt very bloated and distended.  She has tried a little bit of gluten restriction I think but has not kept a food diary to see what might be bothering it.   She will keep a food diary and try IBgard as needed to see if that makes a difference.  Things I will keep in mind would be to consider his small intestinal bacterial overgrowth testing with a lactulose hydrogen breath test.  That might be worthwhile.  I think we need to get more as a pattern and look for potential triggers first.  She is comfortable understanding that between having had a colonoscopy in 2019 and the imaging and labs etc. being okay that there is nothing bad going on.

## 2019-06-11 DIAGNOSIS — H04123 Dry eye syndrome of bilateral lacrimal glands: Secondary | ICD-10-CM | POA: Diagnosis not present

## 2019-06-11 DIAGNOSIS — H16223 Keratoconjunctivitis sicca, not specified as Sjogren's, bilateral: Secondary | ICD-10-CM | POA: Diagnosis not present

## 2019-07-25 MED FILL — FLUTICASONE PROP 50 MCG SPR: 50 | 60 days supply | Qty: 16 | Fill #3

## 2019-09-09 DIAGNOSIS — M25511 Pain in right shoulder: Secondary | ICD-10-CM | POA: Diagnosis not present

## 2019-09-09 DIAGNOSIS — Z23 Encounter for immunization: Secondary | ICD-10-CM | POA: Diagnosis not present

## 2019-09-09 DIAGNOSIS — M7531 Calcific tendinitis of right shoulder: Secondary | ICD-10-CM | POA: Diagnosis not present

## 2019-09-09 DIAGNOSIS — Z0000 Encounter for general adult medical examination without abnormal findings: Secondary | ICD-10-CM | POA: Diagnosis not present

## 2019-09-09 DIAGNOSIS — Z1231 Encounter for screening mammogram for malignant neoplasm of breast: Secondary | ICD-10-CM | POA: Diagnosis not present

## 2019-09-09 DIAGNOSIS — S43431A Superior glenoid labrum lesion of right shoulder, initial encounter: Secondary | ICD-10-CM | POA: Diagnosis not present

## 2019-09-09 DIAGNOSIS — R635 Abnormal weight gain: Secondary | ICD-10-CM | POA: Diagnosis not present

## 2019-09-09 DIAGNOSIS — Z9189 Other specified personal risk factors, not elsewhere classified: Secondary | ICD-10-CM | POA: Diagnosis not present

## 2019-09-23 ENCOUNTER — Encounter: Payer: Self-pay | Admitting: Obstetrics and Gynecology

## 2019-09-23 ENCOUNTER — Telehealth: Payer: Self-pay | Admitting: Obstetrics and Gynecology

## 2019-09-23 ENCOUNTER — Other Ambulatory Visit (HOSPITAL_COMMUNITY)
Admission: RE | Admit: 2019-09-23 | Discharge: 2019-09-23 | Disposition: A | Discharge status: Home / Self Care | Payer: 59 | Source: Ambulatory Visit | Attending: Obstetrics and Gynecology | Admitting: Obstetrics and Gynecology

## 2019-09-23 ENCOUNTER — Ambulatory Visit: Payer: 59 | Admitting: Obstetrics and Gynecology

## 2019-09-23 ENCOUNTER — Other Ambulatory Visit: Payer: Self-pay

## 2019-09-23 VITALS — BP 124/68 | HR 75 | Ht 63.0 in | Wt 159.0 lb

## 2019-09-23 DIAGNOSIS — N950 Postmenopausal bleeding: Secondary | ICD-10-CM | POA: Insufficient documentation

## 2019-09-23 NOTE — Progress Notes (Signed)
GYNECOLOGY  VISIT   HPI: 59 y.o.   Married White or Caucasian Not Hispanic or Latino  female   [phone] with Patient's last menstrual period was 01/26/2015 (approximate).   here for post menopausal bleeding that started on Friday 09/20/19. Patient states that bleeding has stayed about the same. She states that when she urinates that she has seen "nikle" size clots. She is using regular tampons, changing them 3 x a day. Minimal intermittent discomfort.  She was constipated, no straining. Not sexually active for the week prior to the bleeding starting.   GYNECOLOGIC HISTORY: Patient's last menstrual period was 01/26/2015 (approximate). Contraception:none  Menopausal hormone therapy: none         OB History    Gravida  2   Para  2   Term  2   Preterm      AB      Living  2     SAB      TAB      Ectopic      Multiple      Live Births  2              Patient Active Problem List   Diagnosis Date Noted  . Allergic rhinitis 06/21/2018  . Pure hypercholesterolemia 06/21/2018  . Family history of breast cancer in sister 06/21/2018  . History of depression 06/21/2018  . Vaginal dryness 06/21/2018  . Family history of colon cancer in father 12/04/2017  . Hot flashes 06/15/2017  . Hx of adenomatous colonic polyps 08/13/2015    Past Medical History:  Diagnosis Date  . Allergy   . Colon polyps   . Constipation    uses figs and that helps   . High cholesterol   . History of colon polyps   . Hormone disorder   . Hx of ovarian cyst   . Peptic ulcer     Past Surgical History:  Procedure Laterality Date  . BUNIONECTOMY Bilateral   . COLONOSCOPY    . OSTEOCHONDROMA EXCISION    . POLYPECTOMY    . UPPER GASTROINTESTINAL ENDOSCOPY    . WISDOM TOOTH EXTRACTION      Current Outpatient Medications  Medication Sig Dispense Refill  . cholecalciferol (VITAMIN D) 1000 units tablet Take 1,000 Units by mouth daily.    . cycloSPORINE (RESTASIS) 0.05 % ophthalmic emulsion      . FLUoxetine (PROZAC) 20 MG tablet Take 1 tablet (20 mg total) by mouth daily. NEEDS CAPSULES INSTEAD OF TABLETS DUE TO PRICE. 90 tablet 1  . fluticasone (FLONASE) 50 MCG/ACT nasal spray     . levocetirizine (XYZAL) 5 MG tablet Take 1 tablet (5 mg total) by mouth every evening. 90 tablet 3  . magnesium gluconate (MAGONATE) 500 MG tablet Take 500 mg by mouth 2 (two) times daily.    . Multiple Vitamin (MULTIVITAMIN) tablet Take 1 tablet by mouth daily.    . valACYclovir (VALTREX) 500 MG tablet One po bid x 5 days 10 tablet 11   No current facility-administered medications for this visit.     ALLERGIES: Azithromycin  Family History  Problem Relation Age of Onset  . Skin cancer Mother   . Heart attack Mother   . Hypertension Mother   . Arthritis Mother   . Diabetes Mother   . Colon cancer Father   . COPD Father   . Breast cancer Sister   . Hypertension Sister   . Hypertension Brother   . Colon polyps Brother   .  Heart attack Brother   . Breast cancer Maternal Aunt   . Colon cancer Maternal Grandmother   . Colon polyps Brother   . Hashimoto's thyroiditis Sister   . Esophageal cancer Neg Hx   . Rectal cancer Neg Hx   . Stomach cancer Neg Hx     Social History   Socioeconomic History  . Marital status: Married    Spouse name: Not on file  . Number of children: Not on file  . Years of education: Not on file  . Highest education level: Not on file  Occupational History  . Not on file  Tobacco Use  . Smoking status: Never Smoker  . Smokeless tobacco: Never Used  Vaping Use  . Vaping Use: Never used  Substance and Sexual Activity  . Alcohol use: Yes    Alcohol/week: 6.0 standard drinks    Types: 6 Glasses of wine per week  . Drug use: Never  . Sexual activity: Yes    Birth control/protection: Post-menopausal, Other-see comments    Comment: vasectomy   Other Topics Concern  . Not on file  Social History Narrative  . Not on file   Social Determinants of Health    Financial Resource Strain:   . Difficulty of Paying Living Expenses:   Food Insecurity:   . Worried About Charity fundraiser in the Last Year:   . Arboriculturist in the Last Year:   Transportation Needs:   . Film/video editor (Medical):   Marland Kitchen Lack of Transportation (Non-Medical):   Physical Activity:   . Days of Exercise per Week:   . Minutes of Exercise per Session:   Stress:   . Feeling of Stress :   Social Connections:   . Frequency of Communication with Friends and Family:   . Frequency of Social Gatherings with Friends and Family:   . Attends Religious Services:   . Active Member of Clubs or Organizations:   . Attends Archivist Meetings:   Marland Kitchen Marital Status:   Intimate Partner Violence:   . Fear of Current or Ex-Partner:   . Emotionally Abused:   Marland Kitchen Physically Abused:   . Sexually Abused:     ROS  PHYSICAL EXAMINATION:    BP 124/68   Pulse 75   Ht 5\' 3"  (1.6 m)   Wt 159 lb (72.1 kg)   LMP 01/26/2015 (Approximate)   SpO2 98%   BMI 28.17 kg/m     General appearance: alert, cooperative and appears stated age Neck: no adenopathy, supple, symmetrical, trachea midline and thyroid normal to inspection and palpation Abdomen: soft, non-tender; non distended, no masses,  no organomegaly  Pelvic: External genitalia:  no lesions              Urethra:  normal appearing urethra with no masses, tenderness or lesions              Bartholins and Skenes: normal                 Vagina: normal appearing vagina with normal color and discharge, no lesions              Cervix: no lesions              Bimanual Exam:  Uterus:  normal size, contour, position, consistency, mobility, non-tender and anteverted              Adnexa: no mass, fullness, tenderness  The risks of endometrial biopsy were reviewed and a consent was obtained.  A speculum was placed in the vagina and the cervix was cleansed with Hibiclens. A tenaculum was placed on the cervix and  the pipelle was placed into the endometrial cavity. The uterus sounded to ~8 cm. The endometrial biopsy was performed, taking care to get a representative sample, sampling 360 degrees of the uterine cavity. Minimal tissue was obtained. The cavity had the characteristic gritty texture. The tenaculum and speculum were removed. There were no complications.   Chaperone was present for exam.  ASSESSMENT Postmenopausal bleeding    PLAN Pap done Endometrial biopsy done Sonohysterogram ordered   An After Visit Summary was printed and given to the patient.

## 2019-09-23 NOTE — Telephone Encounter (Signed)
Spoke with patient. Patient is postmenopausal, LMP approximately 5 years ago. Reports she started bleeding on 7/30/, reports continued light flow, requires pad or tampon. Denies heavy bleeding, pain, vag odor, urinary symptoms, fever/chills. No HRT. Patient is traveling out of town in the morning at [place] scheduled for today at [place] with Dr. Talbert Nan. Patient verbalizes understanding and is agreeable.   OV scheduled for today.  Will close encounter.

## 2019-09-23 NOTE — Telephone Encounter (Signed)
Appointment Request From: Destiny Lewis    With Provider: Salvadore Dom, MD [facility]]    Preferred Date Range: 09/23/2019 - 09/23/2019    Preferred Times: Any Time    Reason for visit: Office Visit    Comments:  post menopausal bleeding

## 2019-09-23 NOTE — Patient Instructions (Signed)

## 2019-09-24 ENCOUNTER — Telehealth: Payer: Self-pay | Admitting: Obstetrics and Gynecology

## 2019-09-24 LAB — CYTOLOGY - PAP
Comment: NEGATIVE
Diagnosis: NEGATIVE
High risk HPV: NEGATIVE

## 2019-09-24 NOTE — Telephone Encounter (Signed)
Call to patient. Per DPR, OK to leave message on voicemail.   Left voicemail requesting a return call to [facility] to review benefits and schedule recommended Sonohysterogram with Jill Jertson, MD 

## 2019-09-26 ENCOUNTER — Other Ambulatory Visit: Payer: Self-pay | Admitting: *Deleted

## 2019-09-26 DIAGNOSIS — N950 Postmenopausal bleeding: Secondary | ICD-10-CM

## 2019-09-26 NOTE — Telephone Encounter (Signed)
Spoke with patient regarding benefits for recommended Sonohysterogram. Patient acknowledges understanding of information presented. Patient is aware of cancellation policy. Patient scheduled appointment for 10/08/2019 at 0130PM with Dr. Talbert Nan. Patient stated that she is out of town and declined earlier appointment. Encounter closed.

## 2019-09-27 LAB — SURGICAL PATHOLOGY

## 2019-10-07 DIAGNOSIS — S43431D Superior glenoid labrum lesion of right shoulder, subsequent encounter: Secondary | ICD-10-CM | POA: Diagnosis not present

## 2019-10-07 DIAGNOSIS — M25511 Pain in right shoulder: Secondary | ICD-10-CM | POA: Diagnosis not present

## 2019-10-08 ENCOUNTER — Other Ambulatory Visit: Payer: 59

## 2019-10-08 ENCOUNTER — Other Ambulatory Visit: Payer: 59 | Admitting: Obstetrics and Gynecology

## 2019-10-11 DIAGNOSIS — Z1231 Encounter for screening mammogram for malignant neoplasm of breast: Secondary | ICD-10-CM | POA: Diagnosis not present

## 2019-10-15 ENCOUNTER — Encounter: Payer: Self-pay | Admitting: Obstetrics and Gynecology

## 2019-10-15 ENCOUNTER — Ambulatory Visit (INDEPENDENT_AMBULATORY_CARE_PROVIDER_SITE_OTHER): Payer: 59

## 2019-10-15 ENCOUNTER — Ambulatory Visit: Payer: 59 | Admitting: Obstetrics and Gynecology

## 2019-10-15 ENCOUNTER — Other Ambulatory Visit: Payer: Self-pay

## 2019-10-15 ENCOUNTER — Other Ambulatory Visit: Payer: Self-pay | Admitting: Obstetrics and Gynecology

## 2019-10-15 VITALS — BP 132/76 | HR 68 | Ht 63.0 in | Wt 159.0 lb

## 2019-10-15 DIAGNOSIS — N950 Postmenopausal bleeding: Secondary | ICD-10-CM

## 2019-10-15 NOTE — Progress Notes (Signed)
GYNECOLOGY  VISIT   HPI: 59 y.o.   Married White or Caucasian Not Hispanic or Latino  female   [phone] with Patient's last menstrual period was 01/26/2015 (approximate).   here for ultrasound consult. She was seen early this month for postmenopausal bleeding. Endometrial biopsy returned with inactive endometrium with extensive breakdown. No atypia or carcinoma. Pap was negative.   GYNECOLOGIC HISTORY: Patient's last menstrual period was 01/26/2015 (approximate). Contraception:PMP Menopausal hormone therapy: none         OB History    Gravida  2   Para  2   Term  2   Preterm      AB      Living  2     SAB      TAB      Ectopic      Multiple      Live Births  2              Patient Active Problem List   Diagnosis Date Noted  . Allergic rhinitis 06/21/2018  . Pure hypercholesterolemia 06/21/2018  . Family history of breast cancer in sister 06/21/2018  . History of depression 06/21/2018  . Vaginal dryness 06/21/2018  . Family history of colon cancer in father 12/04/2017  . Hot flashes 06/15/2017  . Hx of adenomatous colonic polyps 08/13/2015    Past Medical History:  Diagnosis Date  . Allergy   . Colon polyps   . Constipation    uses figs and that helps   . High cholesterol   . History of colon polyps   . Hormone disorder   . Hx of ovarian cyst   . Peptic ulcer     Past Surgical History:  Procedure Laterality Date  . BUNIONECTOMY Bilateral   . COLONOSCOPY    . OSTEOCHONDROMA EXCISION    . POLYPECTOMY    . UPPER GASTROINTESTINAL ENDOSCOPY    . WISDOM TOOTH EXTRACTION      Current Outpatient Medications  Medication Sig Dispense Refill  . cholecalciferol (VITAMIN D) 1000 units tablet Take 1,000 Units by mouth daily.    . cycloSPORINE (RESTASIS) 0.05 % ophthalmic emulsion     . FLUoxetine (PROZAC) 20 MG tablet Take 1 tablet (20 mg total) by mouth daily. NEEDS CAPSULES INSTEAD OF TABLETS DUE TO PRICE. 90 tablet 1  . fluticasone (FLONASE) 50  MCG/ACT nasal spray     . levocetirizine (XYZAL) 5 MG tablet Take 1 tablet (5 mg total) by mouth every evening. 90 tablet 3  . magnesium gluconate (MAGONATE) 500 MG tablet Take 500 mg by mouth 2 (two) times daily.    . Multiple Vitamin (MULTIVITAMIN) tablet Take 1 tablet by mouth daily.    . valACYclovir (VALTREX) 500 MG tablet One po bid x 5 days 10 tablet 11   No current facility-administered medications for this visit.     ALLERGIES: Azithromycin  Family History  Problem Relation Age of Onset  . Skin cancer Mother   . Heart attack Mother   . Hypertension Mother   . Arthritis Mother   . Diabetes Mother   . Colon cancer Father   . COPD Father   . Breast cancer Sister   . Hypertension Sister   . Hypertension Brother   . Colon polyps Brother   . Heart attack Brother   . Breast cancer Maternal Aunt   . Colon cancer Maternal Grandmother   . Colon polyps Brother   . Hashimoto's thyroiditis Sister   . Esophageal cancer Neg  Hx   . Rectal cancer Neg Hx   . Stomach cancer Neg Hx     Social History   Socioeconomic History  . Marital status: Married    Spouse name: Not on file  . Number of children: Not on file  . Years of education: Not on file  . Highest education level: Not on file  Occupational History  . Not on file  Tobacco Use  . Smoking status: Never Smoker  . Smokeless tobacco: Never Used  Vaping Use  . Vaping Use: Never used  Substance and Sexual Activity  . Alcohol use: Yes    Alcohol/week: 6.0 standard drinks    Types: 6 Glasses of wine per week  . Drug use: Never  . Sexual activity: Yes    Birth control/protection: Post-menopausal, Other-see comments    Comment: vasectomy   Other Topics Concern  . Not on file  Social History Narrative  . Not on file   Social Determinants of Health   Financial Resource Strain:   . Difficulty of Paying Living Expenses: Not on file  Food Insecurity:   . Worried About Charity fundraiser in the Last Year: Not on file    . Ran Out of Food in the Last Year: Not on file  Transportation Needs:   . Lack of Transportation (Medical): Not on file  . Lack of Transportation (Non-Medical): Not on file  Physical Activity:   . Days of Exercise per Week: Not on file  . Minutes of Exercise per Session: Not on file  Stress:   . Feeling of Stress : Not on file  Social Connections:   . Frequency of Communication with Friends and Family: Not on file  . Frequency of Social Gatherings with Friends and Family: Not on file  . Attends Religious Services: Not on file  . Active Member of Clubs or Organizations: Not on file  . Attends Archivist Meetings: Not on file  . Marital Status: Not on file  Intimate Partner Violence:   . Fear of Current or Ex-Partner: Not on file  . Emotionally Abused: Not on file  . Physically Abused: Not on file  . Sexually Abused: Not on file    Review of Systems  All other systems reviewed and are negative.   PHYSICAL EXAMINATION:    BP 132/76   Pulse 68   Ht 5\' 3"  (1.6 m)   Wt 159 lb (72.1 kg)   LMP 01/26/2015 (Approximate)   SpO2 98%   BMI 28.17 kg/m     General appearance: alert, cooperative and appears stated age  Ultrasound images reviewed with the patient. Ultrasound is normal with a thin uniform endometrium.   ASSESSMENT Postmenopausal bleeding, atrophy on biopsy, thin uniform endometrium on ultrasound.     PLAN Patient reassured Call with further bleeding

## 2019-10-29 MED FILL — FLUTICASONE PROP 50 MCG SPR: 50 | 60 days supply | Qty: 16 | Fill #4

## 2019-10-29 MED FILL — LEVOCETIRIZINE 5 MG TABLET: 5 | 90 days supply | Qty: 90 | Fill #3

## 2019-11-18 DIAGNOSIS — Z23 Encounter for immunization: Secondary | ICD-10-CM | POA: Diagnosis not present

## 2019-12-05 MED FILL — FLUoxetine HCL 20 MG CAPS: 20 | 90 days supply | Qty: 90 | Fill #3

## 2019-12-23 MED FILL — FLUTICASONE PROP 50 MCG SPR: 50 | 60 days supply | Qty: 16 | Fill #5

## 2020-01-09 ENCOUNTER — Other Ambulatory Visit: Payer: Self-pay

## 2020-01-09 ENCOUNTER — Ambulatory Visit
Admission: RE | Admit: 2020-01-09 | Discharge: 2020-01-09 | Disposition: A | Discharge status: Home / Self Care | Payer: 59 | Source: Ambulatory Visit | Attending: Obstetrics and Gynecology | Admitting: Obstetrics and Gynecology

## 2020-01-09 DIAGNOSIS — Z803 Family history of malignant neoplasm of breast: Secondary | ICD-10-CM

## 2020-01-09 DIAGNOSIS — Z9189 Other specified personal risk factors, not elsewhere classified: Secondary | ICD-10-CM

## 2020-01-09 DIAGNOSIS — Z1239 Encounter for other screening for malignant neoplasm of breast: Secondary | ICD-10-CM

## 2020-01-09 DIAGNOSIS — N6489 Other specified disorders of breast: Secondary | ICD-10-CM | POA: Diagnosis not present

## 2020-01-09 MED ORDER — GADOBUTROL 1 MMOL/ML IV SOLN
7.0000 mL | Freq: Once | INTRAVENOUS | Status: AC | PRN
  Administered 2020-01-09: 7 mL via INTRAVENOUS

## 2020-03-02 ENCOUNTER — Other Ambulatory Visit (HOSPITAL_COMMUNITY): Payer: Self-pay | Admitting: Physician Assistant

## 2020-03-02 MED FILL — FLUoxetine HCL 20 MG CAPS: 20 | 90 days supply | Qty: 90 | Fill #0

## 2020-03-02 MED FILL — LEVOCETIRIZINE 5 MG TABLET: 5 | 90 days supply | Qty: 90 | Fill #0

## 2020-03-10 DIAGNOSIS — H2513 Age-related nuclear cataract, bilateral: Secondary | ICD-10-CM | POA: Diagnosis not present

## 2020-03-10 DIAGNOSIS — H40013 Open angle with borderline findings, low risk, bilateral: Secondary | ICD-10-CM | POA: Diagnosis not present

## 2020-03-10 DIAGNOSIS — H25013 Cortical age-related cataract, bilateral: Secondary | ICD-10-CM | POA: Diagnosis not present

## 2020-03-10 DIAGNOSIS — H04123 Dry eye syndrome of bilateral lacrimal glands: Secondary | ICD-10-CM | POA: Diagnosis not present

## 2020-03-24 ENCOUNTER — Other Ambulatory Visit (HOSPITAL_COMMUNITY): Payer: Self-pay | Admitting: Physician Assistant

## 2020-03-24 DIAGNOSIS — D225 Melanocytic nevi of trunk: Secondary | ICD-10-CM | POA: Diagnosis not present

## 2020-03-24 DIAGNOSIS — L718 Other rosacea: Secondary | ICD-10-CM | POA: Diagnosis not present

## 2020-03-24 DIAGNOSIS — L814 Other melanin hyperpigmentation: Secondary | ICD-10-CM | POA: Diagnosis not present

## 2020-03-24 DIAGNOSIS — L821 Other seborrheic keratosis: Secondary | ICD-10-CM | POA: Diagnosis not present

## 2020-03-24 MED FILL — metroNIDAZOLE 0.75 % CREA: 0.75 | 30 days supply | Qty: 45 | Fill #0

## 2020-05-12 ENCOUNTER — Other Ambulatory Visit (HOSPITAL_BASED_OUTPATIENT_CLINIC_OR_DEPARTMENT_OTHER): Payer: Self-pay

## 2020-06-02 ENCOUNTER — Other Ambulatory Visit (HOSPITAL_COMMUNITY): Payer: Self-pay

## 2020-06-02 MED FILL — Fluoxetine HCl Cap 20 MG: ORAL | 90 days supply | Qty: 90 | Fill #0 | Status: AC

## 2020-06-02 MED FILL — Levocetirizine Dihydrochloride Tab 5 MG: ORAL | 90 days supply | Qty: 90 | Fill #0 | Status: AC

## 2020-06-03 ENCOUNTER — Other Ambulatory Visit (HOSPITAL_COMMUNITY): Payer: Self-pay

## 2020-07-06 IMAGING — MR MR BREAST BX W/ LOC DEV 1ST LEASION IMAGE BX SPEC MR GUIDE*R*
9 of 14 series · 33 of 48 positions shown · IV contrast (7ml gadavist)
Comparison: Previous exams.
COMPARISON: Previous exams.

Addendum:
CLINICAL DATA: 58-year-old who had a high risk screening MRI of the
breasts demonstrating linear enhancement behind both nipples in the
subareolar locations. She had benign BILATERAL ultrasound-guided
breast biopsies on 01/10/2019 (8 o'clock location RIGHT breast and 3
o'clock location LEFT breast) with pathology revealing benign breast
tissue with dilated ducts and proteinaceous debris.

Biopsy of the linear enhancement in the RIGHT breast is performed.
The linear enhancement in the LEFT breast is much less conspicuous
currently.
EXAM:
MRI GUIDED CORE NEEDLE BIOPSY OF THE RIGHT BREAST
TECHNIQUE: Multiplanar, multisequence MR imaging of the RIGHT breast was
performed both before and after administration of intravenous
contrast.
CONTRAST:  7mL GADAVIST GADOBUTROL 1 MMOL/ML IV.

[Series 2: fiducial bilateral · sagittal · 2.0mm · 1.33mm/px · 4 of 128 slices shown]
[im 1/128]
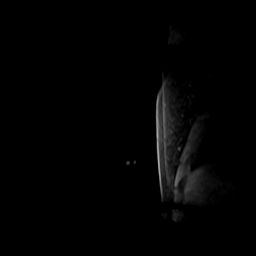
[im 43/128]
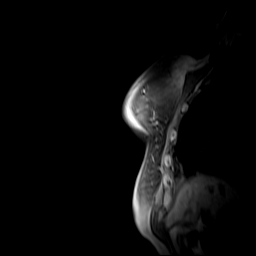
[im 85/128]
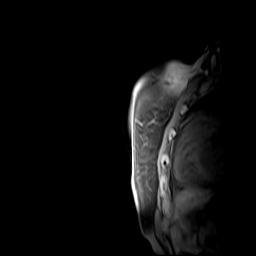
[im 128/128]
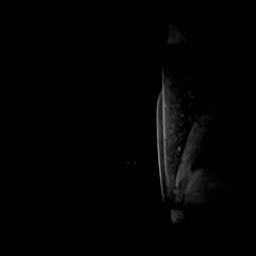

[Series 3: dynamic pre · axial · non-contrast · 1.3mm · 0.73mm/px · z∈[-94,+113]mm · 4 of 160 slices shown]
[im 1/160]
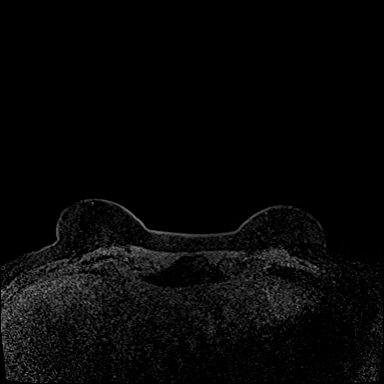
[im 54/160]
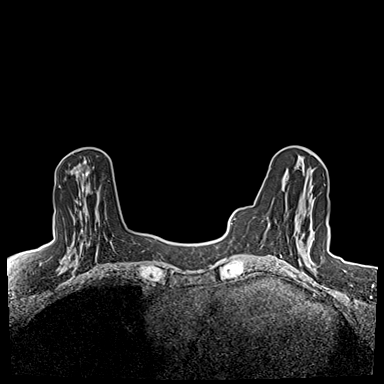
[im 107/160]
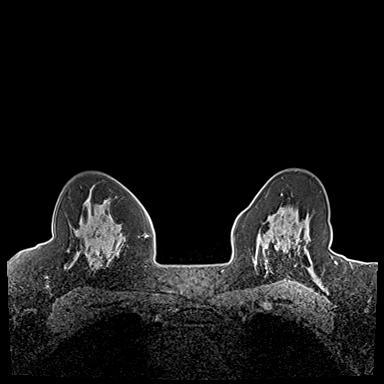
[im 160/160]
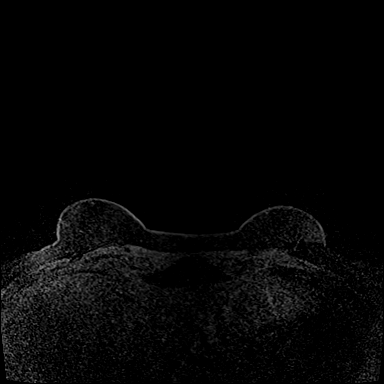

[Series 4: dynamic post 20 · axial · 1.3mm · 0.73mm/px · z∈[-94,+113]mm · 4 of 160 slices shown (1 of 2)]
[im 1/160]
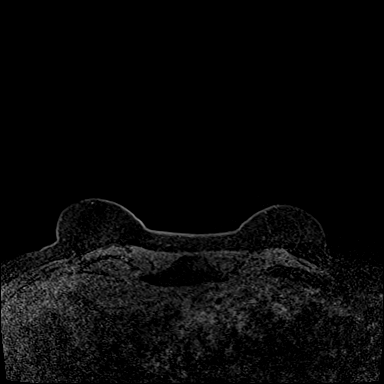
[im 54/160]
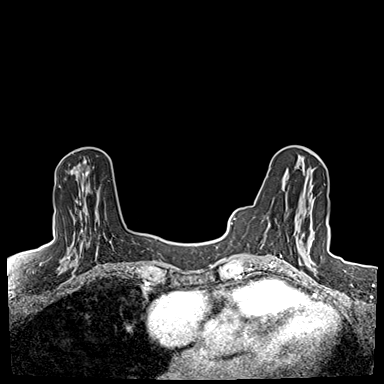
[im 107/160]
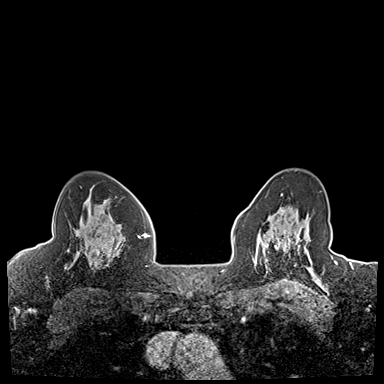
[im 160/160]
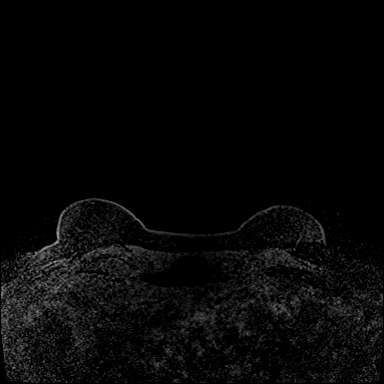

[Series 5: dynamic post 20 · axial · 1.3mm · 0.73mm/px · z∈[-94,+113]mm · 4 of 160 slices shown (2 of 2)]
[im 1/160]
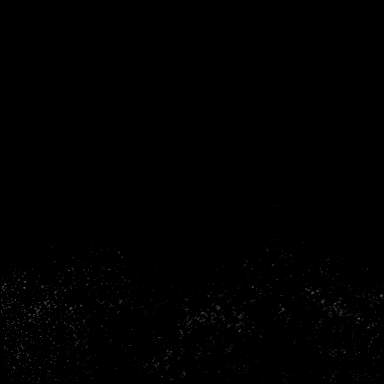
[im 54/160]
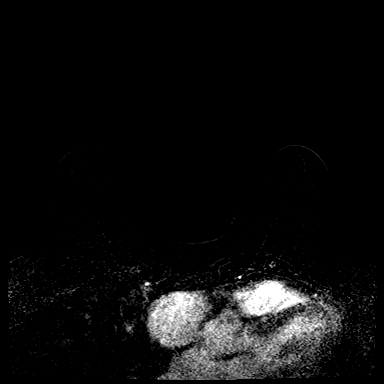
[im 107/160]
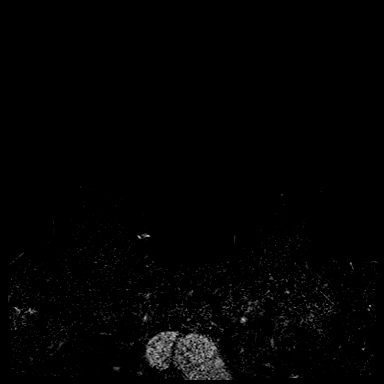
[im 160/160]
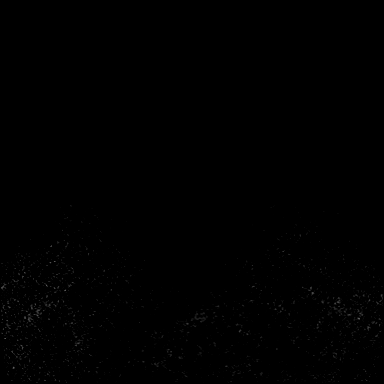

[Series 6: dynamic post 3 · axial · 1.3mm · 0.73mm/px · z∈[-94,+113]mm · 4 of 160 slices shown (1 of 2)]
[im 1/160]
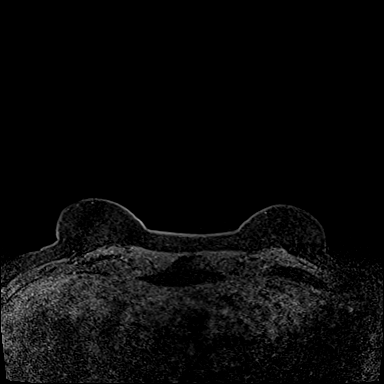
[im 54/160]
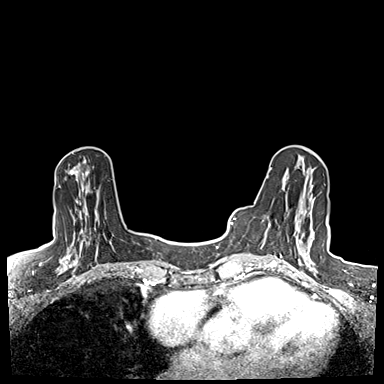
[im 107/160]
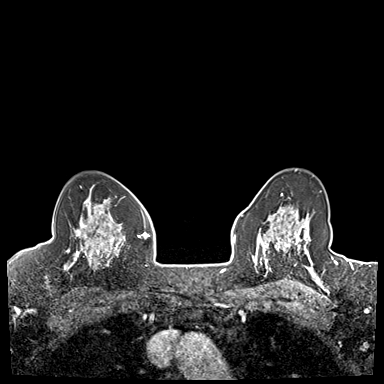
[im 160/160]
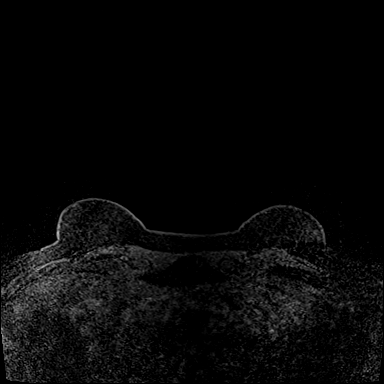

[Series 7: dynamic post 3 · axial · 1.3mm · 0.73mm/px · z∈[-94,+113]mm · 4 of 160 slices shown (2 of 2)]
[im 1/160]
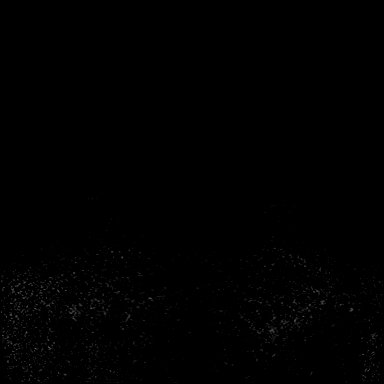
[im 54/160]
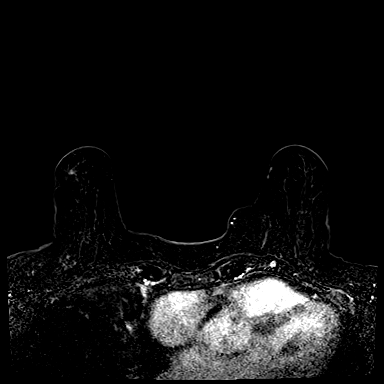
[im 107/160]
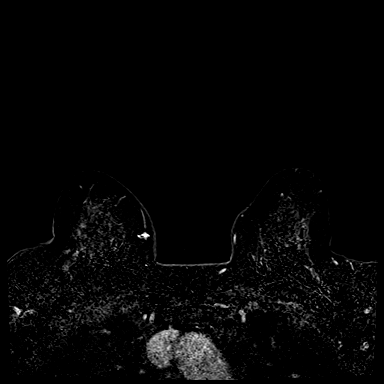
[im 160/160]
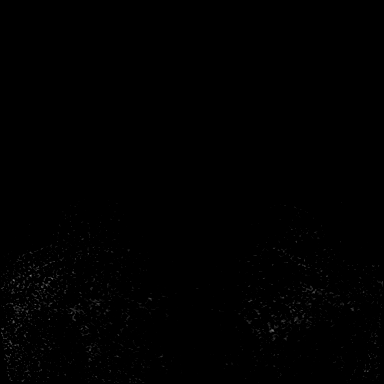

[Series 8: dynamic post 5 · axial · 1.3mm · 0.73mm/px · z∈[-94,+113]mm · 3 of 160 slices shown (1 of 2)]
[im 1/160]
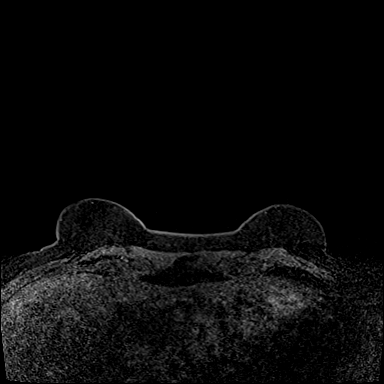
[im 80/160]
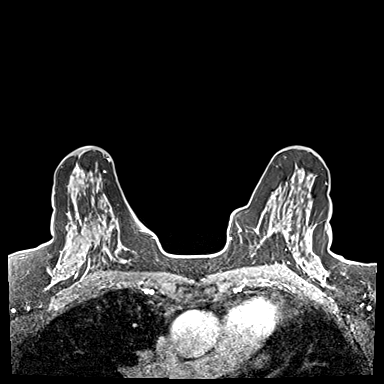
[im 160/160]
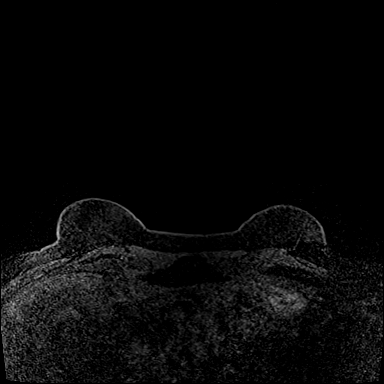

[Series 9: dynamic post 5 · axial · 1.3mm · 0.73mm/px · z∈[-94,+113]mm · 3 of 160 slices shown (2 of 2)]
[im 1/160]
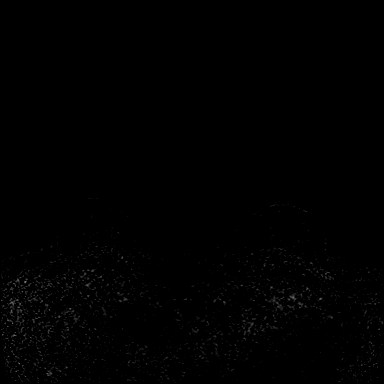
[im 80/160]
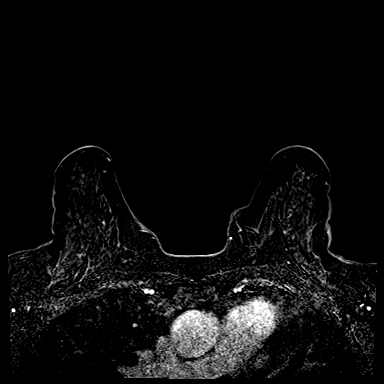
[im 160/160]
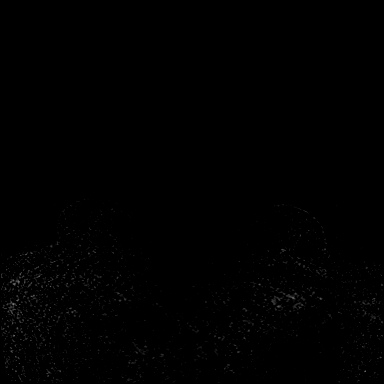

[Series 10: needle confirmation · axial · 1.3mm · 0.73mm/px · z∈[-94,+113]mm · 3 of 160 slices shown]
[im 1/160]
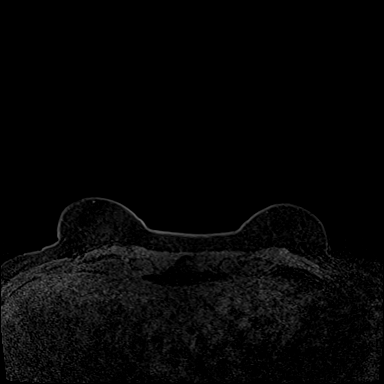
[im 80/160]
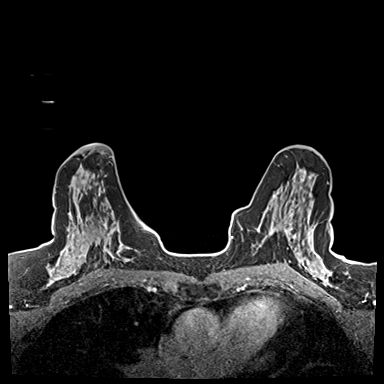
[im 160/160]
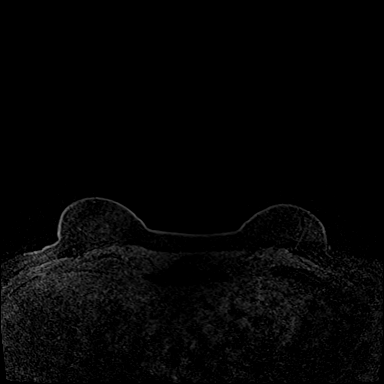

[33 of 48 positions shown; findings below may reference images not displayed]

FINDINGS: I met with the patient, and we discussed the procedure of MRI guided
biopsy, including risks, benefits, and alternatives. Specifically,
we discussed the risks of infection, bleeding, tissue injury, clip
migration, and inadequate sampling. Informed, written consent was
given. The usual time out protocol was performed immediately prior
to the procedure.

Using sterile technique with chlorhexidine as skin antisepsis, 1%
lidocaine and 1% lidocaine with epinephrine as local anesthetic,
using MRI guidance, a 9 gauge vacuum assisted device was used
perform biopsy of the linear enhancement directly behind the RIGHT
nipple using a lateral approach.

Lesion quadrant: Subareolar, slight LOWER INNER QUADRANT.

At the conclusion of the procedure, a cylinder shaped tissue marker
clip was deployed into the biopsy cavity. Follow-up 2-view mammogram
was performed and dictated separately.
IMPRESSION: MRI guided biopsy of linear enhancement in the subareolar location
of the RIGHT breast. No apparent complications.

ADDENDUM:
Pathology revealed FIBROCYSTIC CHANGES WITH PERIDUCTULAR CHRONIC
INFLAMMATION, BENIGN SKIN of the RIGHT breast, subareolar location.
This was found to be concordant by Dr. Yoni Singer.

Pathology results were discussed with the patient by telephone. The
patient reported doing well after the biopsy with tenderness at the
site. Post biopsy instructions and care were reviewed and questions
were answered. The patient was encouraged to call The [REDACTED]

Recommendation is annual screening mammography in August 2019 and
annual screening MRI in December 2019.

Pathology results reported by Prime Lederer, RN on 02/18/2019.

*** End of Addendum ***
FINDINGS: I met with the patient, and we discussed the procedure of MRI guided
biopsy, including risks, benefits, and alternatives. Specifically,
we discussed the risks of infection, bleeding, tissue injury, clip
migration, and inadequate sampling. Informed, written consent was
given. The usual time out protocol was performed immediately prior
to the procedure.

Using sterile technique with chlorhexidine as skin antisepsis, 1%
lidocaine and 1% lidocaine with epinephrine as local anesthetic,
using MRI guidance, a 9 gauge vacuum assisted device was used
perform biopsy of the linear enhancement directly behind the RIGHT
nipple using a lateral approach.

Lesion quadrant: Subareolar, slight LOWER INNER QUADRANT.

At the conclusion of the procedure, a cylinder shaped tissue marker
clip was deployed into the biopsy cavity. Follow-up 2-view mammogram
was performed and dictated separately.
IMPRESSION: MRI guided biopsy of linear enhancement in the subareolar location
of the RIGHT breast. No apparent complications.

## 2020-09-04 ENCOUNTER — Other Ambulatory Visit (HOSPITAL_COMMUNITY): Payer: Self-pay

## 2020-09-04 MED FILL — Fluoxetine HCl Cap 20 MG: ORAL | 90 days supply | Qty: 90 | Fill #1 | Status: AC

## 2020-09-04 MED FILL — Levocetirizine Dihydrochloride Tab 5 MG: ORAL | 90 days supply | Qty: 90 | Fill #1 | Status: AC

## 2020-09-08 DIAGNOSIS — S43431D Superior glenoid labrum lesion of right shoulder, subsequent encounter: Secondary | ICD-10-CM | POA: Diagnosis not present

## 2020-09-09 ENCOUNTER — Other Ambulatory Visit (HOSPITAL_COMMUNITY): Payer: Self-pay

## 2020-09-09 DIAGNOSIS — Z1231 Encounter for screening mammogram for malignant neoplasm of breast: Secondary | ICD-10-CM | POA: Diagnosis not present

## 2020-09-09 DIAGNOSIS — Z23 Encounter for immunization: Secondary | ICD-10-CM | POA: Diagnosis not present

## 2020-09-09 DIAGNOSIS — Z0000 Encounter for general adult medical examination without abnormal findings: Secondary | ICD-10-CM | POA: Diagnosis not present

## 2020-09-09 DIAGNOSIS — J310 Chronic rhinitis: Secondary | ICD-10-CM | POA: Diagnosis not present

## 2020-09-09 DIAGNOSIS — Z124 Encounter for screening for malignant neoplasm of cervix: Secondary | ICD-10-CM | POA: Diagnosis not present

## 2020-09-09 DIAGNOSIS — E7800 Pure hypercholesterolemia, unspecified: Secondary | ICD-10-CM | POA: Diagnosis not present

## 2020-09-09 MED ORDER — LEVOCETIRIZINE DIHYDROCHLORIDE 5 MG PO TABS
ORAL_TABLET | ORAL | 3 refills | Status: DC
  Filled 2020-09-09 – 2020-12-05 (×2): qty 90, 90d supply, fill #0
  Filled 2021-04-22: qty 90, 90d supply, fill #1
  Filled 2021-08-29: qty 90, 90d supply, fill #2

## 2020-09-09 MED ORDER — FLUTICASONE PROPIONATE 50 MCG/ACT NA SUSP
NASAL | 3 refills | Status: DC
Start: 2020-09-09 — End: 2022-06-22
  Filled 2020-09-09: qty 16, 60d supply, fill #0

## 2020-09-10 ENCOUNTER — Other Ambulatory Visit (HOSPITAL_COMMUNITY): Payer: Self-pay

## 2020-09-10 MED ORDER — FLUOXETINE HCL 20 MG PO CAPS
ORAL_CAPSULE | ORAL | 3 refills | Status: DC
Start: 2020-09-10 — End: 2023-07-27
  Filled 2020-09-10 – 2021-04-22 (×2): qty 90, 90d supply, fill #0
  Filled 2021-08-29: qty 90, 90d supply, fill #1

## 2020-10-14 DIAGNOSIS — Z1231 Encounter for screening mammogram for malignant neoplasm of breast: Secondary | ICD-10-CM | POA: Diagnosis not present

## 2020-10-29 ENCOUNTER — Other Ambulatory Visit (HOSPITAL_COMMUNITY): Payer: Self-pay

## 2020-10-29 MED ORDER — RESTASIS 0.05 % OP EMUL
OPHTHALMIC | 3 refills | Status: AC
  Filled 2020-10-29: qty 180, 90d supply, fill #0

## 2020-10-30 ENCOUNTER — Other Ambulatory Visit (HOSPITAL_COMMUNITY): Payer: Self-pay

## 2020-11-13 ENCOUNTER — Other Ambulatory Visit (HOSPITAL_COMMUNITY): Payer: Self-pay

## 2020-11-13 MED ORDER — VALACYCLOVIR HCL 500 MG PO TABS
ORAL_TABLET | ORAL | 5 refills | Status: DC
  Filled 2020-11-13: qty 14, 7d supply, fill #0
  Filled 2020-12-05: qty 14, 7d supply, fill #1

## 2020-11-14 ENCOUNTER — Other Ambulatory Visit (HOSPITAL_COMMUNITY): Payer: Self-pay

## 2020-12-05 ENCOUNTER — Other Ambulatory Visit (HOSPITAL_COMMUNITY): Payer: Self-pay

## 2020-12-05 MED FILL — Fluoxetine HCl Cap 20 MG: ORAL | 90 days supply | Qty: 90 | Fill #2 | Status: AC

## 2020-12-06 ENCOUNTER — Other Ambulatory Visit (HOSPITAL_COMMUNITY): Payer: Self-pay

## 2020-12-07 ENCOUNTER — Other Ambulatory Visit (HOSPITAL_COMMUNITY): Payer: Self-pay

## 2020-12-11 ENCOUNTER — Other Ambulatory Visit (HOSPITAL_COMMUNITY): Payer: Self-pay

## 2020-12-11 MED ORDER — VALACYCLOVIR HCL 500 MG PO TABS
ORAL_TABLET | ORAL | 5 refills | Status: DC
  Filled 2020-12-11: qty 14, 14d supply, fill #0
  Filled 2021-02-18: qty 14, 14d supply, fill #1

## 2020-12-12 ENCOUNTER — Other Ambulatory Visit (HOSPITAL_COMMUNITY): Payer: Self-pay

## 2020-12-14 ENCOUNTER — Other Ambulatory Visit (HOSPITAL_COMMUNITY): Payer: Self-pay

## 2021-02-18 ENCOUNTER — Other Ambulatory Visit (HOSPITAL_COMMUNITY): Payer: Self-pay

## 2021-02-18 MED ORDER — VALACYCLOVIR HCL 500 MG PO TABS
ORAL_TABLET | ORAL | 5 refills | Status: AC
  Filled 2021-02-18: qty 14, 14d supply, fill #0

## 2021-02-19 ENCOUNTER — Other Ambulatory Visit (HOSPITAL_COMMUNITY): Payer: Self-pay

## 2021-03-15 ENCOUNTER — Telehealth: Payer: Self-pay

## 2021-03-15 DIAGNOSIS — Z803 Family history of malignant neoplasm of breast: Secondary | ICD-10-CM

## 2021-03-15 DIAGNOSIS — Z9189 Other specified personal risk factors, not elsewhere classified: Secondary | ICD-10-CM

## 2021-03-15 DIAGNOSIS — Z1231 Encounter for screening mammogram for malignant neoplasm of breast: Secondary | ICD-10-CM

## 2021-03-15 NOTE — Telephone Encounter (Signed)
Patient called requesting breast MRI order however, Last Mammo 08/27/2018-(negative). Not scheduled for a mammogram. She is past due for mammogram.  01/09/20 MRI -RECOMMENDATION: 1.  Annual screening mammography is due in August of 2022.   2.  Annual high risk screening MRI recommended in 1 year.   BI-RADS CATEGORY  1: Negative.

## 2021-03-16 NOTE — Telephone Encounter (Signed)
I would recommend she get her mammogram first (since it is way overdue) and then schedule the MRI.

## 2021-03-16 NOTE — Telephone Encounter (Signed)
I spoke with patient. She actually had Mammo performed with [facility]. I found result in Care Everywhere. "Electronically Signed by: Delma Post on 10/16/2020 9:19 AM Narrative  This result has an attachment that is not available.  MAMMO 3D TOMO SCREENING BILATERAL   DIGITAL BREAST TOMOSYNTHESIS (3-D MAMMOGRAPHY) WAS PERFORMED.   TECHNIQUE: Bilateral digital screening mammogram was obtained and compared to the previous exam. This mammogram was analyzed by a CAD (Computer-Aided Detection) system.   FINDINGS: The breast tissue is heterogeneously dense, which may obscure small masses. There are no suspicious masses or calcifications. There is no significant change from the previous exam.  "   She [facility]. Ok to proceed with placing order?

## 2021-03-16 NOTE — Telephone Encounter (Signed)
Spoke with patient and informed her order placed.  Provided phone # to call and scheduled and answer screening questions. I will watch for it to be scheduled and forward to [facility] for PA.

## 2021-03-16 NOTE — Telephone Encounter (Signed)
Salvadore Dom, MD  You 17 minutes ago (3:41 PM)   Yes, please

## 2021-03-17 ENCOUNTER — Other Ambulatory Visit: Payer: Self-pay

## 2021-03-17 NOTE — Addendum Note (Signed)
Addended by: Ramond Craver on: 03/17/2021 10:41 AM   Modules accepted: Orders

## 2021-03-17 NOTE — Telephone Encounter (Signed)
MRI scheduled 04/01/21. Will route to [facility] to review for PA.

## 2021-03-22 NOTE — Telephone Encounter (Signed)
Patient is self pay. No authorization required.  Encounter previously closed.

## 2021-04-01 ENCOUNTER — Ambulatory Visit
Admission: RE | Admit: 2021-04-01 | Discharge: 2021-04-01 | Disposition: A | Discharge status: Home / Self Care | Payer: No Typology Code available for payment source | Source: Ambulatory Visit | Attending: Obstetrics and Gynecology | Admitting: Obstetrics and Gynecology

## 2021-04-01 DIAGNOSIS — Z1231 Encounter for screening mammogram for malignant neoplasm of breast: Secondary | ICD-10-CM

## 2021-04-01 DIAGNOSIS — Z9189 Other specified personal risk factors, not elsewhere classified: Secondary | ICD-10-CM

## 2021-04-01 DIAGNOSIS — Z803 Family history of malignant neoplasm of breast: Secondary | ICD-10-CM

## 2021-04-01 MED ORDER — GADOBUTROL 1 MMOL/ML IV SOLN
7.0000 mL | Freq: Once | INTRAVENOUS | Status: AC | PRN
  Administered 2021-04-01: 7 mL via INTRAVENOUS

## 2021-04-22 ENCOUNTER — Other Ambulatory Visit (HOSPITAL_COMMUNITY): Payer: Self-pay

## 2021-08-30 ENCOUNTER — Other Ambulatory Visit (HOSPITAL_COMMUNITY): Payer: Self-pay

## 2021-09-01 ENCOUNTER — Other Ambulatory Visit (HOSPITAL_COMMUNITY): Payer: Self-pay

## 2021-09-01 MED ORDER — FLUOXETINE HCL 20 MG PO CAPS
ORAL_CAPSULE | ORAL | 3 refills | Status: DC
  Filled 2021-09-01: qty 90, 90d supply, fill #0
  Filled 2022-02-10: qty 90, 90d supply, fill #1

## 2021-09-01 MED ORDER — VALACYCLOVIR HCL 500 MG PO TABS
ORAL_TABLET | ORAL | 5 refills | Status: AC
Start: 2021-09-01 — End: ?
  Filled 2021-09-01: qty 14, 14d supply, fill #0
  Filled 2022-01-24: qty 14, 14d supply, fill #1

## 2021-10-05 ENCOUNTER — Other Ambulatory Visit (HOSPITAL_COMMUNITY): Payer: Self-pay

## 2021-10-05 MED ORDER — CHLORHEXIDINE GLUCONATE 0.12 % MT SOLN
OROMUCOSAL | 0 refills | Status: DC
Start: 2021-10-05 — End: 2022-06-22
  Filled 2021-10-05: qty 473, 16d supply, fill #0

## 2021-11-21 ENCOUNTER — Other Ambulatory Visit (HOSPITAL_COMMUNITY): Payer: Self-pay

## 2021-11-23 ENCOUNTER — Other Ambulatory Visit (HOSPITAL_COMMUNITY): Payer: Self-pay

## 2021-11-23 MED ORDER — LEVOCETIRIZINE DIHYDROCHLORIDE 5 MG PO TABS
5.0000 mg | ORAL_TABLET | Freq: Every evening | ORAL | 3 refills | Status: DC
  Filled 2021-11-23: qty 90, 90d supply, fill #0
  Filled 2022-02-10: qty 30, 30d supply, fill #1
  Filled 2022-03-24: qty 30, 30d supply, fill #2

## 2021-11-24 ENCOUNTER — Other Ambulatory Visit (HOSPITAL_COMMUNITY): Payer: Self-pay

## 2021-12-21 ENCOUNTER — Other Ambulatory Visit (HOSPITAL_COMMUNITY): Payer: Self-pay

## 2021-12-21 MED ORDER — PREDNISONE 20 MG PO TABS
40.0000 mg | ORAL_TABLET | Freq: Every day | ORAL | 0 refills | Status: DC
  Filled 2021-12-21: qty 10, 5d supply, fill #0

## 2021-12-21 MED ORDER — AMOXICILLIN-POT CLAVULANATE 875-125 MG PO TABS
1.0000 | ORAL_TABLET | Freq: Two times a day (BID) | ORAL | 0 refills | Status: DC
  Filled 2021-12-21: qty 20, 10d supply, fill #0

## 2022-01-01 ENCOUNTER — Ambulatory Visit
Admission: EM | Admit: 2022-01-01 | Discharge: 2022-01-01 | Disposition: A | Discharge status: Home / Self Care | Attending: Urgent Care | Admitting: Urgent Care

## 2022-01-01 DIAGNOSIS — J3489 Other specified disorders of nose and nasal sinuses: Secondary | ICD-10-CM | POA: Diagnosis not present

## 2022-01-01 DIAGNOSIS — R0981 Nasal congestion: Secondary | ICD-10-CM

## 2022-01-01 DIAGNOSIS — J309 Allergic rhinitis, unspecified: Secondary | ICD-10-CM | POA: Diagnosis not present

## 2022-01-01 MED ORDER — TRIAMCINOLONE ACETONIDE 40 MG/ML IJ SUSP
60.0000 mg | Freq: Once | INTRAMUSCULAR | Status: AC
  Administered 2022-01-01: 60 mg via INTRAMUSCULAR

## 2022-01-01 NOTE — ED Provider Notes (Signed)
[facility]  Note:  This document was prepared using Systems analyst and may include unintentional dictation errors.  MRN: [id number] DOB: 10/09/1960  Subjective:   Destiny Lewis is a 61 y.o. female presenting for 1 month history of persistent and worsening facial pain, facial pressure, postnasal drainage and coughing.  Symptoms are worse overnight.  Patient is propping her head up and taking Flonase, Xyzal, pseudoephedrine consistently.  Of note, she did test positive for COVID-19 in mid October.  She did not end up undergoing a course of Paxlovid due to the timeline of her illness.  Ultimately she underwent a course of Augmentin and prednisone to address a secondary sinusitis.  She had some improvement but did not fully clear the infection and presents with worsening symptoms today after interim improvement.  No smoking.  No history of asthma.  She does have longstanding history of allergic rhinitis.  No current facility-administered medications for this encounter.  Current Outpatient Medications:    amoxicillin-clavulanate (AUGMENTIN) 875-125 MG tablet, Take 1 tablet by mouth 2 (two) times daily with a meal., Disp: 20 tablet, Rfl: 0   chlorhexidine (PERIDEX) 0.12 % solution, Swish with 1 tablespoonfull (15 MLs) in the mouth undiluted for 30 seconds then spit out. Use 2 times a day in the morning and evening after brushing teeth. Use until gone., Disp: 473 mL, Rfl: 0   cholecalciferol (VITAMIN D) 1000 units tablet, Take 1,000 Units by mouth daily., Disp: , Rfl:    cycloSPORINE (RESTASIS) 0.05 % ophthalmic emulsion, , Disp: , Rfl:    FLUoxetine (PROZAC) 20 MG capsule, TAKE 1 CAPSULE BY MOUTH DAILY, Disp: 90 capsule, Rfl: 3   FLUoxetine (PROZAC) 20 MG capsule, Take one capsule (20 mg dose) by mouth daily., Disp: 90 capsule, Rfl: 3   FLUoxetine (PROZAC) 20 MG capsule, Take one capsule (20 mg dose) by mouth daily., Disp: 90 capsule, Rfl: 3   FLUoxetine  (PROZAC) 20 MG tablet, Take 1 tablet (20 mg total) by mouth daily. NEEDS CAPSULES INSTEAD OF TABLETS DUE TO PRICE., Disp: 90 tablet, Rfl: 1   fluticasone (FLONASE) 50 MCG/ACT nasal spray, , Disp: , Rfl:    fluticasone (FLONASE) 50 MCG/ACT nasal spray, Use 1 spray in each nostril once daily as directed, Disp: 48 g, Rfl: 3   levocetirizine (XYZAL) 5 MG tablet, Take 1 tablet (5 mg total) by mouth every evening., Disp: 90 tablet, Rfl: 3   levocetirizine (XYZAL) 5 MG tablet, TAKE 1 TABLET BY MOUTH EVERY EVENING, Disp: 90 tablet, Rfl: 3   levocetirizine (XYZAL) 5 MG tablet, Take 1 tablet (5 mg total) by mouth every evening., Disp: 90 tablet, Rfl: 3   magnesium gluconate (MAGONATE) 500 MG tablet, Take 500 mg by mouth 2 (two) times daily., Disp: , Rfl:    Multiple Vitamin (MULTIVITAMIN) tablet, Take 1 tablet by mouth daily., Disp: , Rfl:    predniSONE (DELTASONE) 20 MG tablet, Take 2 tablets (40 mg total) by mouth daily with breakfast., Disp: 10 tablet, Rfl: 0   RESTASIS 0.05 % ophthalmic emulsion, Instill 1 drop in both eyes twice daily as directed, Disp: 180 each, Rfl: 3   valACYclovir (VALTREX) 500 MG tablet, One po bid x 5 days, Disp: 10 tablet, Rfl: 11   valACYclovir (VALTREX) 500 MG tablet, Take 1 tablet by mouth as needed., Disp: 14 tablet, Rfl: 5   valACYclovir (VALTREX) 500 MG tablet, Take 1 tablet by mouth as directed as needed., Disp: 14 tablet, Rfl: 5  valACYclovir (VALTREX) 500 MG tablet, Take one tablet (500 mg dose) by mouth as needed., Disp: 14 tablet, Rfl: 5   valACYclovir (VALTREX) 500 MG tablet, Take one tablet (500 mg dose) by mouth as needed., Disp: 14 tablet, Rfl: 5   Allergies  Allergen Reactions   Azithromycin Other (See Comments)    Blisters     Past Medical History:  Diagnosis Date   Allergy    Colon polyps    Constipation    uses figs and that helps    High cholesterol    History of colon polyps    Hormone disorder    Hx of ovarian cyst    Peptic ulcer      Past  Surgical History:  Procedure Laterality Date   BUNIONECTOMY Bilateral    COLONOSCOPY     OSTEOCHONDROMA EXCISION     POLYPECTOMY     UPPER GASTROINTESTINAL ENDOSCOPY     WISDOM TOOTH EXTRACTION      Family History  Problem Relation Age of Onset   Skin cancer Mother    Heart attack Mother    Hypertension Mother    Arthritis Mother    Diabetes Mother    Colon cancer Father    COPD Father    Breast cancer Sister    Hypertension Sister    Hypertension Brother    Colon polyps Brother    Heart attack Brother    Breast cancer Maternal Aunt    Colon cancer Maternal Grandmother    Colon polyps Brother    Hashimoto's thyroiditis Sister    Esophageal cancer Neg Hx    Rectal cancer Neg Hx    Stomach cancer Neg Hx     Social History   Tobacco Use   Smoking status: Never   Smokeless tobacco: Never  Vaping Use   Vaping Use: Never used  Substance Use Topics   Alcohol use: Yes    Alcohol/week: 6.0 standard drinks of alcohol    Types: 6 Glasses of wine per week   Drug use: Never    ROS   Objective:   Vitals: BP 124/77 (BP Location: Right Arm)   Pulse 80   Temp 98.3 F (36.8 C) (Oral)   Resp 18   LMP 01/26/2015 (Approximate)   SpO2 94%   Physical Exam Constitutional:      General: She is not in acute distress.    Appearance: Normal appearance. She is well-developed. She is not ill-appearing, toxic-appearing or diaphoretic.  HENT:     Head: Normocephalic and atraumatic.     Nose: Congestion present. No rhinorrhea.     Comments: Nasal mucosa boggy and edematous.    Mouth/Throat:     Mouth: Mucous membranes are moist.     Pharynx: No oropharyngeal exudate or posterior oropharyngeal erythema.  Eyes:     General: No scleral icterus.       Right eye: No discharge.        Left eye: No discharge.     Extraocular Movements: Extraocular movements intact.  Cardiovascular:     Rate and Rhythm: Normal rate and regular rhythm.     Heart sounds: Normal heart sounds. No  murmur heard.    No friction rub. No gallop.  Pulmonary:     Effort: Pulmonary effort is normal. No respiratory distress.     Breath sounds: No stridor. No wheezing, rhonchi or rales.  Chest:     Chest wall: No tenderness.  Skin:    General: Skin is warm and  dry.  Neurological:     General: No focal deficit present.     Mental Status: She is alert and oriented to person, place, and time.  Psychiatric:        Mood and Affect: Mood normal.        Behavior: Behavior normal.     IM triamcinolone at 60 mg.  Assessment and Plan :   PDMP not reviewed this encounter.  1. Allergic rhinitis, unspecified seasonality, unspecified trigger   2. Sinus congestion   3. Sinus pressure     I suspect that this is more inflammatory with her allergic rhinitis and therefore we agreed to use IM triamcinolone as above.  Recommended holding a nasal steroid and pseudoephedrine.  Maintain Xyzal.  Should patient experience persistent symptoms in the next 24 to 48 hours, discussed the use of levofloxacin to address ongoing secondary sinusitis.  Patient will keep me posted. Deferred imaging given clear cardiopulmonary exam, hemodynamically stable vital signs. Counseled patient on potential for adverse effects with medications prescribed/recommended today, ER and return-to-clinic precautions discussed, patient verbalized understanding.    Jaynee Eagles, [place] 01/01/22 1202

## 2022-01-01 NOTE — ED Triage Notes (Addendum)
Patient presents top [facility] for sinus infection. States facial pain, cough, post nasal drip x 1 month. Treating with sudafed and nasal irrigation. Completed the prednisone with no improvement.   Denies fever.

## 2022-01-03 ENCOUNTER — Other Ambulatory Visit (HOSPITAL_COMMUNITY): Payer: Self-pay

## 2022-01-03 MED ORDER — DOXYCYCLINE HYCLATE 100 MG PO TABS
100.0000 mg | ORAL_TABLET | Freq: Two times a day (BID) | ORAL | 0 refills | Status: DC
  Filled 2022-01-03: qty 20, 10d supply, fill #0

## 2022-01-24 ENCOUNTER — Other Ambulatory Visit (HOSPITAL_COMMUNITY): Payer: Self-pay

## 2022-02-02 ENCOUNTER — Other Ambulatory Visit (HOSPITAL_COMMUNITY): Payer: Self-pay

## 2022-02-02 MED ORDER — METHOCARBAMOL 500 MG PO TABS
500.0000 mg | ORAL_TABLET | Freq: Two times a day (BID) | ORAL | 0 refills | Status: DC
  Filled 2022-02-02: qty 20, 10d supply, fill #0

## 2022-02-02 MED ORDER — MELOXICAM 7.5 MG PO TABS
7.5000 mg | ORAL_TABLET | Freq: Every day | ORAL | 0 refills | Status: DC
  Filled 2022-02-02: qty 10, 10d supply, fill #0

## 2022-02-04 ENCOUNTER — Telehealth: Payer: Self-pay | Admitting: Urgent Care

## 2022-02-04 ENCOUNTER — Other Ambulatory Visit (HOSPITAL_COMMUNITY): Payer: Self-pay

## 2022-02-04 MED ORDER — PREDNISONE 10 MG PO TABS
ORAL_TABLET | ORAL | 0 refills | Status: DC
  Filled 2022-02-04: qty 21, 12d supply, fill #0

## 2022-02-04 MED ORDER — CYCLOBENZAPRINE HCL 10 MG PO TABS
10.0000 mg | ORAL_TABLET | Freq: Every evening | ORAL | 5 refills | Status: DC | PRN
  Filled 2022-02-04: qty 30, 30d supply, fill #0

## 2022-02-04 NOTE — Telephone Encounter (Signed)
Patient called and needs a refill of her muscle relaxant Flexeril.

## 2022-02-10 ENCOUNTER — Other Ambulatory Visit (HOSPITAL_COMMUNITY): Payer: Self-pay

## 2022-02-11 ENCOUNTER — Other Ambulatory Visit (HOSPITAL_COMMUNITY): Payer: Self-pay

## 2022-03-07 ENCOUNTER — Other Ambulatory Visit (HOSPITAL_COMMUNITY): Payer: Self-pay

## 2022-03-07 MED ORDER — CYCLOSPORINE 0.05 % OP EMUL
OPHTHALMIC | 0 refills | Status: DC
  Filled 2022-03-07: qty 180, 90d supply, fill #0

## 2022-03-09 ENCOUNTER — Other Ambulatory Visit (HOSPITAL_COMMUNITY): Payer: Self-pay

## 2022-03-25 ENCOUNTER — Other Ambulatory Visit (HOSPITAL_COMMUNITY): Payer: Self-pay

## 2022-03-30 ENCOUNTER — Telehealth: Payer: Self-pay

## 2022-03-30 NOTE — Telephone Encounter (Signed)
Patient called stating it is time to schedule Breast MRI.  Last office visit was 09/25/2019.

## 2022-03-31 NOTE — Telephone Encounter (Signed)
Please call the patient. She should be getting annual exams, including a breast exam, yearly mammograms along with the MRI's. Please schedule her for an annual exam, and order the MRI. If she is doing the annual exam with her primary, then she should be getting her MRI's through there as well. Either is fine.

## 2022-03-31 NOTE — Telephone Encounter (Signed)
Patient was informed of Dr. Gentry Fitz recommendations.  Patient said her PCP is doing her yearly exams. She said she will contact them and see if they can order her breast MRI.

## 2022-03-31 NOTE — Telephone Encounter (Signed)
I spoke with Destiny Lewis. She said last year MRI was performed because patient had mammo at [facility] 09/2020.  I will check with her and confirm she had it done this past August is well. They cannot schedule MRI unless she has had mammogram.

## 2022-04-01 ENCOUNTER — Other Ambulatory Visit: Payer: Self-pay | Admitting: Physician Assistant

## 2022-04-15 ENCOUNTER — Other Ambulatory Visit (HOSPITAL_COMMUNITY): Payer: Self-pay

## 2022-04-15 MED ORDER — CYCLOSPORINE 0.05 % OP EMUL: 1.0000 [drp] | Freq: Two times a day (BID) | OPHTHALMIC | 1 refills | Status: AC

## 2022-04-16 ENCOUNTER — Other Ambulatory Visit (HOSPITAL_COMMUNITY): Payer: Self-pay

## 2022-04-24 ENCOUNTER — Other Ambulatory Visit (HOSPITAL_COMMUNITY): Payer: Self-pay

## 2022-05-21 ENCOUNTER — Emergency Department (HOSPITAL_COMMUNITY): Payer: Non-veteran care

## 2022-05-21 ENCOUNTER — Ambulatory Visit (HOSPITAL_COMMUNITY)
Admission: EM | Admit: 2022-05-21 | Discharge: 2022-05-21 | Disposition: A | Discharge status: Home / Self Care | Attending: Internal Medicine | Admitting: Internal Medicine

## 2022-05-21 ENCOUNTER — Emergency Department (HOSPITAL_COMMUNITY)
Admission: EM | Admit: 2022-05-21 | Discharge: 2022-05-21 | Disposition: A | Discharge status: Home / Self Care | Attending: Emergency Medicine | Admitting: Emergency Medicine

## 2022-05-21 ENCOUNTER — Encounter (HOSPITAL_COMMUNITY): Payer: Self-pay

## 2022-05-21 ENCOUNTER — Other Ambulatory Visit: Payer: Self-pay

## 2022-05-21 ENCOUNTER — Emergency Department (HOSPITAL_COMMUNITY)

## 2022-05-21 DIAGNOSIS — E876 Hypokalemia: Secondary | ICD-10-CM | POA: Diagnosis not present

## 2022-05-21 DIAGNOSIS — R000 Tachycardia, unspecified: Secondary | ICD-10-CM | POA: Diagnosis not present

## 2022-05-21 DIAGNOSIS — R002 Palpitations: Secondary | ICD-10-CM | POA: Diagnosis present

## 2022-05-21 LAB — BASIC METABOLIC PANEL
Anion gap: 11 (ref 5–15)
BUN: 14 mg/dL (ref 8–23)
CO2: 19 mmol/L — ABNORMAL LOW (ref 22–32)
Calcium: 8.4 mg/dL — ABNORMAL LOW (ref 8.9–10.3)
Chloride: 104 mmol/L (ref 98–111)
Creatinine, Ser: 0.85 mg/dL (ref 0.44–1.00)
GFR, Estimated: >60 mL/min (ref >60)
Glucose, Bld: 134 mg/dL — ABNORMAL HIGH (ref 70–99)
Potassium: 3.3 mmol/L — ABNORMAL LOW (ref 3.5–5.1)
Sodium: 134 mmol/L — ABNORMAL LOW (ref 135–145)

## 2022-05-21 LAB — CBC WITH DIFFERENTIAL/PLATELET
Abs Immature Granulocytes: 0.04 10*3/uL (ref 0.00–0.07)
Basophils Absolute: 0.1 10*3/uL (ref 0.0–0.1)
Basophils Relative: 1 %
Eosinophils Absolute: 0 10*3/uL (ref 0.0–0.5)
Eosinophils Relative: 0 %
HCT: 33.9 % — ABNORMAL LOW (ref 36.0–46.0)
Hemoglobin: 11.8 g/dL — ABNORMAL LOW (ref 12.0–15.0)
Immature Granulocytes: 0 %
Lymphocytes Relative: 20 %
Lymphs Abs: 1.9 10*3/uL (ref 0.7–4.0)
MCH: 31.8 pg (ref 26.0–34.0)
MCHC: 34.8 g/dL (ref 30.0–36.0)
MCV: 91.4 fL (ref 80.0–100.0)
Monocytes Absolute: 1.1 10*3/uL — ABNORMAL HIGH (ref 0.1–1.0)
Monocytes Relative: 12 %
Neutro Abs: 6.3 10*3/uL (ref 1.7–7.7)
Neutrophils Relative %: 67 %
Platelets: 285 10*3/uL (ref 150–400)
RBC: 3.71 MIL/uL — ABNORMAL LOW (ref 3.87–5.11)
RDW: 13 % (ref 11.5–15.5)
WBC: 9.3 10*3/uL (ref 4.0–10.5)
nRBC: 0 % (ref 0.0–0.2)

## 2022-05-21 LAB — TROPONIN I (HIGH SENSITIVITY)
Troponin I (High Sensitivity): 6 ng/L (ref <18)
Troponin I (High Sensitivity): 5 ng/L (ref <18)

## 2022-05-21 LAB — MAGNESIUM: Magnesium: 1.8 mg/dL (ref 1.7–2.4)

## 2022-05-21 MED ORDER — POTASSIUM CHLORIDE CRYS ER 20 MEQ PO TBCR
40.0000 meq | EXTENDED_RELEASE_TABLET | Freq: Once | ORAL | Status: AC
  Administered 2022-05-21: 40 meq via ORAL
  Filled 2022-05-21: qty 2

## 2022-05-21 MED ORDER — SODIUM CHLORIDE 0.9 % IV BOLUS
1000.0000 mL | Freq: Once | INTRAVENOUS | Status: AC
  Administered 2022-05-21: 1000 mL via INTRAVENOUS

## 2022-05-21 NOTE — ED Triage Notes (Signed)
Patient went to urgent care for palpitations. Had a Delta 8 drink and glass of wine. HR up to 135 at urgent care. 117 per EMS. Patient's apple watch was alarming for high heart rate. Patient denies chest pain or shortness of breath.

## 2022-05-21 NOTE — ED Provider Notes (Signed)
[facility] Provider Note   CSN: [id number] Arrival date & time: 05/21/22  1823     History  Chief Complaint  Patient presents with   Palpitations    Destiny Lewis is a 62 y.o. female.  Denies any significant past medical history presents the ER complaining of fast heart rate today.  She states she went to the gym today and did Zumba and yoga and then did 2 hours of yard work outside.  She then drank a can of delta 9 seltzer that she states was 25 mg of THC.  Also had CBD in it.  She also had glass of wine.  She states she initially was feeling tingly all over.  She got a notification on her Apple Watch that her heart rate was over 120 bpm for over an hour.  She states she noted that she felt somewhat "off" and was noticing her heart racing after that so she went to urgent care.  Heart rate was 137 bpm there so she was sent to the ED for further evaluation.  He states that she noted on the back of the hand after drinking all of it and 20 minutes that it was supposed to be drained about a quart of a Candida time and waiting in between to see how you feel before continue drinking the drink.  She states she has had THC Gummies in the past that were 15 milligrams without issue.  Reports she had 2 cups of coffee today.  Denies any other substance use.   Palpitations      Home Medications Prior to Admission medications   Medication Sig Start Date End Date Taking? Authorizing Provider  amoxicillin-clavulanate (AUGMENTIN) 875-125 MG tablet Take 1 tablet by mouth 2 (two) times daily with a meal. 12/21/21   Jaynee Eagles, PA-C  chlorhexidine (PERIDEX) 0.12 % solution Swish with 1 tablespoonfull (15 MLs) in the mouth undiluted for 30 seconds then spit out. Use 2 times a day in the morning and evening after brushing teeth. Use until gone. 10/05/21     cholecalciferol (VITAMIN D) 1000 units tablet Take 1,000 Units by mouth daily.    [provider]   cyclobenzaprine (FLEXERIL) 10 MG tablet Take 1 tablet (10 mg total) by mouth at bedtime as needed for muscle spasms. 02/04/22   Jaynee Eagles, PA-C  cycloSPORINE (RESTASIS) 0.05 % ophthalmic emulsion  12/25/18   [provider]  cycloSPORINE (RESTASIS) 0.05 % ophthalmic emulsion Place 1 drop into both eyes 2 (two) times daily. 04/15/22     doxycycline (VIBRA-TABS) 100 MG tablet Take 1 tablet (100 mg total) by mouth 2 (two) times daily with food 01/03/22   Jaynee Eagles, PA-C  FLUoxetine (PROZAC) 20 MG capsule TAKE 1 CAPSULE BY MOUTH DAILY 03/02/20 03/07/21  Harrison Mons, PA  FLUoxetine (PROZAC) 20 MG capsule Take one capsule (20 mg dose) by mouth daily. 09/10/20     FLUoxetine (PROZAC) 20 MG capsule Take one capsule (20 mg dose) by mouth daily. 09/01/21     FLUoxetine (PROZAC) 20 MG tablet Take 1 tablet (20 mg total) by mouth daily. NEEDS CAPSULES INSTEAD OF TABLETS DUE TO PRICE. 08/27/18   Elby Showers, MD  fluticasone Asencion Islam) 50 MCG/ACT nasal spray  02/23/18   [provider]  fluticasone (FLONASE) 50 MCG/ACT nasal spray Use 1 spray in each nostril once daily as directed 09/09/20     levocetirizine (XYZAL) 5 MG tablet Take 1 tablet (5 mg total)  by mouth every evening. 02/19/18   Kennith Gain, MD  levocetirizine (XYZAL) 5 MG tablet TAKE 1 TABLET BY MOUTH EVERY EVENING 03/02/20 03/02/21  Harrison Mons, PA  levocetirizine (XYZAL) 5 MG tablet Take 1 tablet (5 mg total) by mouth every evening. 11/23/21     magnesium gluconate (MAGONATE) 500 MG tablet Take 500 mg by mouth 2 (two) times daily.    [provider]  meloxicam (MOBIC) 7.5 MG tablet Take 1 tablet (7.5 mg total) by mouth daily. 02/02/22     methocarbamol (ROBAXIN) 500 MG tablet Take 1 tablet (500 mg total) by mouth 2 (two) times daily. 02/02/22     Multiple Vitamin (MULTIVITAMIN) tablet Take 1 tablet by mouth daily.    [provider]  predniSONE (DELTASONE) 10 MG tablet Take 1 tablet 3 times a day for  2 days-- 1 tablet twice a day for 5 days-- 1 tablet daily till finished 02/04/22     predniSONE (DELTASONE) 20 MG tablet Take 2 tablets (40 mg total) by mouth daily with breakfast. 12/21/21   Jaynee Eagles, PA-C  RESTASIS 0.05 % ophthalmic emulsion Instill 1 drop in both eyes twice daily as directed 10/29/20     valACYclovir (VALTREX) 500 MG tablet One po bid x 5 days 11/17/18   Elby Showers, MD  valACYclovir (VALTREX) 500 MG tablet Take 1 tablet by mouth as needed. 11/13/20     valACYclovir (VALTREX) 500 MG tablet Take 1 tablet by mouth as directed as needed. 12/11/20     valACYclovir (VALTREX) 500 MG tablet Take one tablet (500 mg dose) by mouth as needed. 02/18/21     valACYclovir (VALTREX) 500 MG tablet Take one tablet (500 mg dose) by mouth as needed. 09/01/21         Allergies    Azithromycin    Review of Systems   Review of Systems  Cardiovascular:  Positive for palpitations.    Physical Exam Updated Vital Signs BP 119/69 (BP Location: Right Arm)   Pulse 87   Temp 98.3 F (36.8 C) (Oral)   Resp 16   LMP 01/26/2015 (Approximate)   SpO2 96%  Physical Exam Vitals and nursing note reviewed.  Constitutional:      General: She is not in acute distress.    Appearance: She is well-developed.  HENT:     Head: Normocephalic and atraumatic.     Mouth/Throat:     Mouth: Mucous membranes are moist.  Eyes:     Conjunctiva/sclera: Conjunctivae normal.  Cardiovascular:     Rate and Rhythm: Regular rhythm. Tachycardia present.     Heart sounds: No murmur heard. Pulmonary:     Effort: Pulmonary effort is normal. No respiratory distress.     Breath sounds: Normal breath sounds.  Abdominal:     Palpations: Abdomen is soft.     Tenderness: There is no abdominal tenderness.  Musculoskeletal:        General: No swelling. Normal range of motion.     Cervical back: Neck supple.  Skin:    General: Skin is warm and dry.     Capillary Refill: Capillary refill takes less than 2 seconds.   Neurological:     General: No focal deficit present.     Mental Status: She is alert and oriented to person, place, and time.  Psychiatric:        Mood and Affect: Mood normal.     ED Results / Procedures / Treatments   Labs (all labs ordered  are listed, but only abnormal results are displayed) Labs Reviewed  CBC WITH DIFFERENTIAL/PLATELET - Abnormal; Notable for the following components:      Result Value   RBC 3.71 (*)    Hemoglobin 11.8 (*)    HCT 33.9 (*)    Monocytes Absolute 1.1 (*)    All other components within normal limits  BASIC METABOLIC PANEL - Abnormal; Notable for the following components:   Sodium 134 (*)    Potassium 3.3 (*)    CO2 19 (*)    Glucose, Bld 134 (*)    Calcium 8.4 (*)    All other components within normal limits  MAGNESIUM  TROPONIN I (HIGH SENSITIVITY)  TROPONIN I (HIGH SENSITIVITY)    EKG EKG Interpretation  Date/Time:  Saturday May 21 2022 19:12:04 EDT Ventricular Rate:  112 PR Interval:  184 QRS Duration: 78 QT Interval:  334 QTC Calculation: 455 R Axis:   0 Text Interpretation: Sinus tachycardia Low voltage QRS Septal infarct , age undetermined Abnormal ECG No previous ECGs available Confirmed by Nanda Quinton [phone]) on 05/21/2022 7:44:14 PM  Radiology DG Chest 1 View  Result Date: 05/21/2022 CLINICAL DATA:  Palpitation. EXAM: CHEST  1 VIEW COMPARISON:  None Available. FINDINGS: No focal consolidation, pleural effusion, or pneumothorax. The cardiac silhouette is within normal limits. No acute osseous pathology. IMPRESSION: No active disease. Electronically Signed   By: Anner Crete M.D.   On: 05/21/2022 22:09    Procedures Procedures    Medications Ordered in ED Medications  sodium chloride 0.9 % bolus 1,000 mL (0 mLs Intravenous Stopped 05/21/22 2019)  potassium chloride SA (KLOR-CON M) CR tablet 40 mEq (40 mEq Oral Given 05/21/22 2109)    ED Course/ Medical Decision Making/ A&P                             Medical  Decision Making Ddx: intoxication, dehydration, atrial fibrillation, other Labs: Patient had 2 negative troponins, normal magnesium, CBC shows very mild anemia with hemoglobin 11.8, CMP shows mild hypokalemia at 3.3, sodium is slightly low at 134, CO2 slightly low at 19 also supporting diagnosis of dehydration, normal anion gap.  Imaging: Chest x-ray reviewed interpreted independently by me, no pulm edema or infiltrate, no pneumothorax, radiology read also noted and in agreement with this  Monitor: Patient is maintained on cardiac monitor which shows sinus rhythm initially sinus tachycardia and then normal sinus rhythm  Course: Patient started having fast heart rate today after doing to exercise classes, working outside and drinking wine and a THC containing drink.  Denies drinking a lot of water today.  Patient initially well-appearing but with heart rate of 111, this responded with IV fluids and patient is feeling much better, labs as above.    EKG had slight ST depression and only lead V4, she is not having chest pain and 2 negative tropes so discussed this finding with patient and follow-up with her primary care doctor and will likely need repeat EKG but given that it is a single isolated lead and patient is not having ischemic symptoms do not feel this is needing further workup.  Stable today for discharge.  Patient is feeling better, heart rate back to normal, no signs or symptoms of infection causing her to be tachycardic.  Discussed oral hydration, avoiding overexertion especially when she is not drinking enough fluids and avoiding the THC drink she had today which may have contributed to her symptoms  as well.    Amount and/or Complexity of Data Reviewed Labs: ordered. Radiology: ordered.  Risk Prescription drug management.           Final Clinical Impression(s) / ED Diagnoses Final diagnoses:  Palpitations  Hypokalemia    Rx / DC Orders ED Discharge Orders     None          Gwenevere Abbot, PA-C 05/21/22 2250    Margette Fast, MD 05/25/22 1733

## 2022-05-21 NOTE — ED Triage Notes (Signed)
Pt reports irregular heart beat that started today after drinking an energy drink around 3:45. Denies any chest pain or discomfort.

## 2022-05-21 NOTE — Discharge Instructions (Addendum)
Seen today for fast heart rate.  This may have been the beverage he had or dehydration.  Make sure drink plenty fluids and follow-up with your primary care doctor, your labs, chest x-ray and EKG were reassuring.  Come back to the ER if you have any new or worsening symptoms.  Your potassium was little bit low today but given supplements.  Make sure you are including foods like bananas, oranges, milk in your diet to increase potassium intake.

## 2022-05-21 NOTE — ED Notes (Signed)
Patient transported to x-ray. ?

## 2022-05-21 NOTE — ED Notes (Signed)
Patient is being discharged from the Urgent Care and sent to the Emergency Department via [facility]. Per Mare Ferrari, NP, patient is in need of higher level of care due to tacycardia. Patient is aware and verbalizes understanding of plan of care.  Vitals:   05/21/22 1739 05/21/22 1742  BP:  128/75  Pulse: (!) 142   Resp: 18 20  SpO2: 100% 100%

## 2022-05-21 NOTE — ED Provider Notes (Addendum)
Patient presents to urgent care for evaluation of heart palpations and feeling "off" since drinking delta 9 hemp drink and a glass of wine at 3:45pm today. She bought the Delta 9 drink from a hemp store and drank the whole can before realizing that the recommended max dose at a time is 1/4 can. Reports her apple watch notified her of elevated heart rate. This combined with heart palpations caused concern for possible irregular heart rhythm. Denies history of cardiac problems including atrial fibrillation and blood thinner use. Of note, she works out at [employer] regularly but has taken an extended period of time off from going to the gym until this morning when she began to workout again. She wonders if this could also be contributing to heart palpitations. Denies history of thyroid problems. No urinary symptoms, abdominal pain, shortness of breath, back pain, or fever/chills.   Heart rate regular and tachycardic with auscultation. Lungs clear without adventitious sounds. Speaking in full sentences without difficulty or increased respiratory effort. She is overall non-toxic in appearance and non-diaphoretic. Tolerating fluids well in triage sipping on water. EKG shows HR at 135 sinus tachycardia with prolonged QT interval. No ST/T wave changes indicating ACS. No previous EKG available for comparison.   Discussed concern for adverse reaction related to ingestion of significant amount of delta 9 THC plus alcohol. Patient would benefit from workup in the emergency department due to tachycardia and EKG changes. Recommend transport to ED via [facility] for cardiac monitoring due to tachycardia and symptoms, patient is agreeable with plan. IV placed in clinic by nursing staff, 1 liter saline bolus started. Patient discharged to [facility] ED for further workup and evaluation with [facility] in stable condition.    Talbot Grumbling, FNP 05/21/22 2149    Talbot Grumbling, FNP 05/21/22 2151

## 2022-05-23 ENCOUNTER — Other Ambulatory Visit: Payer: Self-pay | Admitting: Physician Assistant

## 2022-05-23 DIAGNOSIS — Z803 Family history of malignant neoplasm of breast: Secondary | ICD-10-CM

## 2022-05-25 ENCOUNTER — Other Ambulatory Visit: Payer: Self-pay | Admitting: Physician Assistant

## 2022-05-25 DIAGNOSIS — Z803 Family history of malignant neoplasm of breast: Secondary | ICD-10-CM

## 2022-06-17 ENCOUNTER — Other Ambulatory Visit (HOSPITAL_COMMUNITY): Payer: Self-pay

## 2022-06-18 ENCOUNTER — Other Ambulatory Visit

## 2022-06-21 NOTE — Progress Notes (Unsigned)
NEW PATIENT Date of Service/Encounter:  06/22/22 Referring provider: [facility] Primary care provider: [facility]  Subjective:  Destiny Lewis is a 62 y.o. female with a PMHx of ademoatous colonic polyps, hypercholesterolemia, depression presenting today for evaluation of chronic rhinitis. History obtained from: chart review and patient.   Chronic rhinitis:  Symptoms include: headaches from sinus pressure, congestion, runny nose red/itchy and watery eyes. Itchy skin everywhere.  Occurs year-round Potential triggers: outdoors Treatments tried: levocetirizine, restasis. Has tried flonase and azelastine. Doesn't tolerate these well. She recently started ipatropium which didn't seem to help.  Previous allergy testing: yes-2019 with our clinic. positive to grass, weed, mold, cockroach  History of reflux/heartburn: no-tums PRN, 2-3 times per month Previous sinus, ear, tonsil, adenoid surgeries: no Has seen ENT for deviated septum. Surgery discussed but she was not ready at that time. She will get treated for sinus infections with antibiotics around 3 times per year.  Other allergy screening: Asthma: no Food allergy: no Medication allergy: yes-azithromcyin caused rash with blisters Hymenoptera allergy: no Urticaria: no Eczema:no History of recurrent infections suggestive of immunodeficency: no Vaccinations are up to date.   Past Medical History: Past Medical History:  Diagnosis Date   Allergy    Colon polyps    Constipation    uses figs and that helps    High cholesterol    History of colon polyps    Hormone disorder    Hx of ovarian cyst    Peptic ulcer    Medication List:  Current Outpatient Medications  Medication Sig Dispense Refill   cholecalciferol (VITAMIN D) 1000 units tablet Take 1,000 Units by mouth daily.     cyclobenzaprine (FLEXERIL) 10 MG tablet Take 1 tablet (10 mg total) by mouth at bedtime as needed for muscle spasms. 30 tablet 5    cycloSPORINE (RESTASIS) 0.05 % ophthalmic emulsion Place 1 drop into both eyes 2 (two) times daily. 1.5 mL 1   EPINEPHrine (EPIPEN 2-PAK) 0.3 mg/0.3 mL IJ SOAJ injection Inject 0.3 mg into the muscle as needed for anaphylaxis. 2 each 2   FLUoxetine (PROZAC) 20 MG capsule Take one capsule (20 mg dose) by mouth daily. 90 capsule 3   FLUoxetine (PROZAC) 20 MG capsule Take one capsule (20 mg dose) by mouth daily. 90 capsule 3   fluticasone (FLONASE) 50 MCG/ACT nasal spray      fluticasone (FLONASE) 50 MCG/ACT nasal spray Use 1 spray in each nostril once daily as directed 48 g 3   levocetirizine (XYZAL) 5 MG tablet Take 1 tablet (5 mg total) by mouth every evening. 90 tablet 3   levocetirizine (XYZAL) 5 MG tablet Take 1 tablet (5 mg total) by mouth every evening. 90 tablet 3   magnesium gluconate (MAGONATE) 500 MG tablet Take 500 mg by mouth 2 (two) times daily.     meloxicam (MOBIC) 7.5 MG tablet Take 1 tablet (7.5 mg total) by mouth daily. 10 tablet 0   methocarbamol (ROBAXIN) 500 MG tablet Take 1 tablet (500 mg total) by mouth 2 (two) times daily. 20 tablet 0   Multiple Vitamin (MULTIVITAMIN) tablet Take 1 tablet by mouth daily.     olopatadine (PATANOL) 0.1 % ophthalmic solution INSTILL 1 DROP IN EACH EYE TWICE A DAY FOR ALLERGIES     RESTASIS 0.05 % ophthalmic emulsion Instill 1 drop in both eyes twice daily as directed 180 each 3   valACYclovir (VALTREX) 500 MG tablet Take one tablet (500 mg dose) by mouth as needed.  14 tablet 5   valACYclovir (VALTREX) 500 MG tablet Take one tablet (500 mg dose) by mouth as needed. 14 tablet 5   No current facility-administered medications for this visit.   Known Allergies:  Allergies  Allergen Reactions   Azithromycin Other (See Comments)    Blisters    Past Surgical History: Past Surgical History:  Procedure Laterality Date   BUNIONECTOMY Bilateral    COLONOSCOPY     OSTEOCHONDROMA EXCISION     POLYPECTOMY     UPPER GASTROINTESTINAL ENDOSCOPY      WISDOM TOOTH EXTRACTION     Family History: Family History  Problem Relation Age of Onset   Skin cancer Mother    Heart attack Mother    Hypertension Mother    Arthritis Mother    Diabetes Mother    Colon cancer Father    COPD Father    Allergic rhinitis Sister    Breast cancer Sister    Hypertension Sister    Allergic rhinitis Sister    Hashimoto's thyroiditis Sister    Hypertension Brother    Colon polyps Brother    Heart attack Brother    Colon polyps Brother    Breast cancer Maternal Aunt    Colon cancer Maternal Grandmother    Esophageal cancer Neg Hx    Rectal cancer Neg Hx    Stomach cancer Neg Hx    Social History: Destiny Lewis lives in a house built 63 years ago, no water damage, hardwood floors with area rugs, gas heating, central AC, 2 dogs, 1 cat, no roaches, not using DM protectin on bedding/pillows, no smoke exposure, retired Engineer, civil (consulting). + HEPA filter in home, not [place] interstate/industrial area.   ROS:  All other systems negative except as noted per HPI.  Objective:  Blood pressure 118/86, pulse 90, temperature (!) 97.1 F (36.2 C), height 5\' 3"  (1.6 m), weight 160 lb 1.6 oz (72.6 kg), last menstrual period 01/26/2015, SpO2 97 %. Body mass index is 28.36 kg/m. Physical Exam:  General Appearance:  Alert, cooperative, no distress, appears stated age, sniffling during exam  Head:  Normocephalic, without obvious abnormality, atraumatic  Eyes:  Conjunctiva clear, EOM's intact  Nose: Nares normal,  septum deviated towards the right, hypertrophic turbinates, normal mucosa, and no visible anterior polyps  Throat: Lips, tongue normal; teeth and gums normal, normal posterior oropharynx  Neck: Supple, symmetrical  Lungs:   clear to auscultation bilaterally, Respirations unlabored, no coughing  Heart:  regular rate and rhythm and no murmur, Appears well perfused  Extremities: No edema  Skin: Skin color, texture, turgor normal and no rashes or lesions on visualized portions  of skin  Neurologic: No gross deficits     Diagnostics: Skin Testing: Environmental allergy panel.  Adequate controls. Results discussed with patient/family.  Airborne Adult Perc - 06/22/22 1300     Time Antigen Placed 1346    Allergen Manufacturer Waynette Buttery    Location Back    Number of Test 59    2. Control-Histamine 1 mg/ml 3+    4. Bahia Negative    5. French Southern Territories Negative    6. Johnson Negative    7. Kentucky Blue Negative    8. Meadow Fescue Negative    9. Perennial Rye Negative    10. Sweet Vernal Negative    11. Timothy Negative    12. Cocklebur Negative    13. Burweed Marshelder Negative    14. Ragweed, short Negative    15. Ragweed, Giant Negative    16. Plantain,  English Negative    17. Lamb's Quarters Negative    18. Sheep Sorrell Negative    19. Rough Pigweed Negative    20. Marsh Elder, Rough Negative    21. Mugwort, Common Negative    22. Ash mix Negative    23. Birch mix Negative    24. Beech American Negative    25. Box, Elder Negative    26. Cedar, red Negative    27. Cottonwood, Guinea-Bissau Negative    28. Elm mix Negative    29. Hickory Negative    30. Maple mix Negative    31. Oak, Guinea-Bissau mix Negative    32. Pecan Pollen Negative    33. Pine mix Negative    34. Sycamore Eastern Negative    35. Walnut, Black Pollen Negative    36. Alternaria alternata Negative    37. Cladosporium Herbarum Negative    38. Aspergillus mix Negative    39. Penicillium mix Negative    40. Bipolaris sorokiniana (Helminthosporium) Negative    41. Drechslera spicifera (Curvularia) Negative    42. Mucor plumbeus Negative    43. Fusarium moniliforme Negative    44. Aureobasidium pullulans (pullulara) Negative    45. Rhizopus oryzae Negative    46. Botrytis cinera Negative    47. Epicoccum nigrum Negative    48. Phoma betae Negative    49. Candida Albicans Negative    50. Trichophyton mentagrophytes Negative    51. Mite, D Farinae  5,000 AU/ml Negative    52. Mite, D  Pteronyssinus  5,000 AU/ml Negative    53. Cat Hair 10,000 BAU/ml Negative    54.  Dog Epithelia Negative    55. Mixed Feathers Negative    56. Horse Epithelia Negative    57. Cockroach, German Negative    58. Mouse Negative    59. Tobacco Leaf Negative             Intradermal - 06/22/22 1445     Time Antigen Placed 1445    Allergen Manufacturer Waynette Buttery    Location Arm    Number of Test 15    Intradermal Select    Control Negative    French Southern Territories Negative    Johnson Negative    7 Grass Negative    Ragweed mix Negative    Weed mix Negative    Tree mix 2+    Mold 1 3+    Mold 2 3+    Mold 3 Negative    Mold 4 4+    Cat 2+    Dog 2+    Cockroach 3+    Mite mix 3+             Allergy testing results were read and interpreted by myself, documented by clinical staff.  Assessment and Plan  Chronic Rhinitis: determined to be Seasonal and Perennial Allergic: - allergy testing today: positive to tree mix, indoor/outdoor molds, dust mites, cockroach, borderline cat and dog - Prevention:  - allergen avoidance when possible - consider allergy shots as long term control of your symptoms by teaching your immune system to be more tolerant of your allergy triggers - Symptom control: - Consider Flonase (fluticasone)1- 2 sprays in each nostril daily.  - Continue Atrovent (Ipratropium Bromide) 1-2 sprays in each nostril up to 3 times a day as needed for runny nose/post nasal drip/drainage.   - Use less frequently if airway gets too dry. - Continue Xyzal (Levocetirinze) 5mg  daily, can increase to twice daily on bad allergy  days.  Allergic Conjunctivitis:  - Start Allergy Eye drops-pataday eye drops 1 drop each up daily as needed - continue ryaltris as prescribed  -Avoid eye drops that say red eye relief as they may contain medications that dry out your eyes.  Follow up : 3 months, sooner if needed Call us once you have spoken with insurance and let us know if you want to do RUSH or  traditional injections It was a pleasure meeting you in clinic today! Thank you for allowing me to participate in your care.  This note in its entirety was forwarded to the Provider who requested this consultation.  Thank you for your kind referral. I appreciate the opportunity to take part in Folasade's care. Please do not hesitate to contact me with questions.  Sincerely,  Tonny Bollman, MD [facility]

## 2022-06-22 ENCOUNTER — Other Ambulatory Visit: Payer: Self-pay

## 2022-06-22 ENCOUNTER — Ambulatory Visit (INDEPENDENT_AMBULATORY_CARE_PROVIDER_SITE_OTHER): Payer: No Typology Code available for payment source | Admitting: Internal Medicine

## 2022-06-22 ENCOUNTER — Encounter: Payer: Self-pay | Admitting: Internal Medicine

## 2022-06-22 VITALS — BP 118/86 | HR 90 | Temp 97.1°F | Ht 63.0 in | Wt 160.1 lb

## 2022-06-22 DIAGNOSIS — J3089 Other allergic rhinitis: Secondary | ICD-10-CM | POA: Insufficient documentation

## 2022-06-22 DIAGNOSIS — J302 Other seasonal allergic rhinitis: Secondary | ICD-10-CM | POA: Diagnosis not present

## 2022-06-22 DIAGNOSIS — H1013 Acute atopic conjunctivitis, bilateral: Secondary | ICD-10-CM | POA: Diagnosis not present

## 2022-06-22 MED ORDER — OLOPATADINE HCL 0.1 % OP SOLN: OPHTHALMIC | 11 refills | Status: AC

## 2022-06-22 MED ORDER — EPINEPHRINE 0.3 MG/0.3ML IJ SOAJ: 0.3000 mg | INTRAMUSCULAR | 2 refills | Status: AC | PRN

## 2022-06-22 MED ORDER — FLUTICASONE PROPIONATE 50 MCG/ACT NA SUSP: 2.0000 | Freq: Every day | NASAL | 11 refills | Status: AC

## 2022-06-22 NOTE — Patient Instructions (Signed)
Chronic Rhinitis: determined to be Seasonal and Perennial Allergic: - allergy testing today: positive to tree mix, indoor/outdoor molds, dust mites, cockroach, borderline cat and dog - Prevention:  - allergen avoidance when possible - consider allergy shots as long term control of your symptoms by teaching your immune system to be more tolerant of your allergy triggers - Symptom control: - Consider Flonase (fluticasone)1- 2 sprays in each nostril daily.  - Continue Atrovent (Ipratropium Bromide) 1-2 sprays in each nostril up to 3 times a day as needed for runny nose/post nasal drip/drainage.   - Use less frequently if airway gets too dry. - Continue Xyzal (Levocetirinze) 5mg  daily, can increase to twice daily on bad allergy days.  Allergic Conjunctivitis:  - Start Allergy Eye drops-pataday eye drops 1 drop each up daily as needed - continue ryaltris as prescribed  -Avoid eye drops that say red eye relief as they may contain medications that dry out your eyes.  Follow up : 3 months, sooner if needed Call us once you have spoken with insurance and let us know if you want to do RUSH or traditional injections It was a pleasure meeting you in clinic today! Thank you for allowing me to participate in your care.  Tonny Bollman, MD [facility]  Reducing Pollen Exposure  The American Academy of Allergy, Asthma and Immunology suggests the following steps to reduce your exposure to pollen during allergy seasons.    Do not hang sheets or clothing out to dry; pollen may collect on these items. Do not mow lawns or spend time around freshly cut grass; mowing stirs up pollen. Keep windows closed at night.  Keep car windows closed while driving. Minimize morning activities outdoors, a time when pollen counts are usually at their highest. Stay indoors as much as possible when pollen counts or humidity is high and on windy days when pollen tends to remain in the air longer. Use air  conditioning when possible.  Many air conditioners have filters that trap the pollen spores. Use a HEPA room air filter to remove pollen form the indoor air you breathe. Control of Mold Allergen   Mold and fungi can grow on a variety of surfaces provided certain temperature and moisture conditions exist.  Outdoor molds grow on plants, decaying vegetation and soil.  The major outdoor mold, Alternaria and Cladosporium, are found in very high numbers during hot and dry conditions.  Generally, a late Summer - Fall peak is seen for common outdoor fungal spores.  Rain will temporarily lower outdoor mold spore count, but counts rise rapidly when the rainy period ends.  The most important indoor molds are Aspergillus and Penicillium.  Dark, humid and poorly ventilated basements are ideal sites for mold growth.  The next most common sites of mold growth are the bathroom and the kitchen.  Outdoor (Seasonal) Mold Control  Use air conditioning and keep windows closed Avoid exposure to decaying vegetation. Avoid leaf raking. Avoid grain handling. Consider wearing a face mask if working in moldy areas.    Indoor (Perennial) Mold Control   Maintain humidity below 50%. Clean washable surfaces with 5% bleach solution. Remove sources e.g. contaminated carpets.   Control of Dog or Cat Allergen  Avoidance is the best way to manage a dog or cat allergy. If you have a dog or cat and are allergic to dog or cats, consider removing the dog or cat from the home. If you have a dog or cat but don't  want to find it a new home, or if your family wants a pet even though someone in the household is allergic, here are some strategies that may help keep symptoms at bay:  Keep the pet out of your bedroom and restrict it to only a few rooms. Be advised that keeping the dog or cat in only one room will not limit the allergens to that room. Don't pet, hug or kiss the dog or cat; if you do, wash your hands with soap and  water. High-efficiency particulate air (HEPA) cleaners run continuously in a bedroom or living room can reduce allergen levels over time. Regular use of a high-efficiency vacuum cleaner or a central vacuum can reduce allergen levels. Giving your dog or cat a bath at least once a week can reduce airborne allergen. DUST MITE AVOIDANCE MEASURES:  There are three main measures that need and can be taken to avoid house dust mites:  Reduce accumulation of dust in general -reduce furniture, clothing, carpeting, books, stuffed animals, especially in bedroom  Separate yourself from the dust -use pillow and mattress encasements (can be found at stores such as [employer] or online) -avoid direct exposure to air condition flow -use a HEPA filter device, especially in the bedroom; you can also use a HEPA filter vacuum cleaner -wipe dust with a moist towel instead of a dry towel or broom when cleaning  Decrease mites and/or their secretions -wash clothing and linen and stuffed animals at highest temperature possible, at least every 2 weeks -stuffed animals can also be placed in a bag and put in a freezer overnight  Despite the above measures, it is impossible to eliminate dust mites or their allergen completely from your home.  With the above measures the burden of mites in your home can be diminished, with the goal of minimizing your allergic symptoms.  Success will be reached only when implementing and using all means together.

## 2022-06-27 ENCOUNTER — Telehealth: Payer: Self-pay | Admitting: Internal Medicine

## 2022-06-27 NOTE — Telephone Encounter (Signed)
Destiny Lewis would like to start Rush Immunotherapy.  Monisha has signed her paperwork and it has been placed in bulk scanning.  I was the witness that signed off on it.  She would like to do Rush here in [place] if at all possible.  Please call her and schedule her. Thank you!!!

## 2022-06-28 ENCOUNTER — Other Ambulatory Visit: Payer: Self-pay | Admitting: Internal Medicine

## 2022-06-28 DIAGNOSIS — J302 Other seasonal allergic rhinitis: Secondary | ICD-10-CM

## 2022-06-28 NOTE — Progress Notes (Signed)
Aeroallergen Immunotherapy   Ordering Provider: Dr. Tonny Bollman   Patient Details  Name: Shaqunda Bedoya  MRN: [id number]  Date of Birth: Feb 04, 1961   Order 1 of 2   Vial Label: M/CR/DM   0.2 ml (Volume)  1:20 Concentration -- Alternaria alternata  0.2 ml (Volume)  1:20 Concentration -- Cladosporium herbarum  0.2 ml (Volume)  1:10 Concentration -- Aspergillus mix  0.2 ml (Volume)  1:10 Concentration -- Penicillium mix  0.2 ml (Volume)  1:10 Concentration -- Fusarium moniliforme  0.2 ml (Volume)  1:40 Concentration -- Aureobasidium pullulans  0.2 ml (Volume)  1:10 Concentration -- Rhizopus oryzae  0.3 ml (Volume)  1:20 Concentration -- Cockroach, German  0.5 ml (Volume)   AU Concentration -- Mite Mix (DF 5,000 & DP 5,000)    2.2  ml Extract Subtotal  2.8  ml Diluent  5.0  ml Maintenance Total   Schedule:  RUSH  Silver Vial (1:1,000,000): RUSH  Blue Vial (1:100,000): RUSH  Yellow Vial (1:10,000): RUSH  Green Vial (1:1,000): Schedule B (6 doses)  Red Vial (1:100): Schedule A (10 doses)   Special Instructions: RUSH, green B, red A

## 2022-06-28 NOTE — Progress Notes (Signed)
RUSH AIT Rx written. 

## 2022-06-28 NOTE — Progress Notes (Signed)
VIALS MADE CLOSER TO APPT.

## 2022-06-28 NOTE — Progress Notes (Signed)
Aeroallergen Immunotherapy   Ordering Provider: Dr. Tonny Bollman   Patient Details  Name: Destiny Lewis  MRN: [id number]  Date of Birth: [birth year]   Order 2 of 2   Vial Label: T/D/C   0.5 ml (Volume)  1:20 Concentration -- Eastern 10 Tree Mix (also Sweet Gum)  0.5 ml (Volume)  1:10 Concentration -- Cat Hair  0.5 ml (Volume)  1:10 Concentration -- Dog Epithelia    1.5  ml Extract Subtotal  3.5  ml Diluent  5.0  ml Maintenance Total   Schedule:  RUSH  Silver Vial (1:1,000,000): RUSH  Blue Vial (1:100,000): RUSH  Yellow Vial (1:10,000): RUSH  Green Vial (1:1,000): Schedule B (6 doses)  Red Vial (1:100): Schedule A (10 doses)   Special Instructions: RUSH, green B, red A

## 2022-06-29 NOTE — Telephone Encounter (Signed)
Thank you :)

## 2022-07-05 DIAGNOSIS — J302 Other seasonal allergic rhinitis: Secondary | ICD-10-CM | POA: Diagnosis not present

## 2022-07-05 NOTE — Progress Notes (Signed)
VIALS EXP 07-05-23 

## 2022-07-06 DIAGNOSIS — J3081 Allergic rhinitis due to animal (cat) (dog) hair and dander: Secondary | ICD-10-CM | POA: Diagnosis not present

## 2022-07-14 ENCOUNTER — Telehealth: Payer: Self-pay | Admitting: Allergy

## 2022-07-14 NOTE — Telephone Encounter (Signed)
Patient called and was reading the paper work on rush and needs to know how to get the rx she suppose to take . Please call her and help her.  [phone]

## 2022-07-15 ENCOUNTER — Telehealth: Payer: Self-pay | Admitting: Internal Medicine

## 2022-07-15 NOTE — Telephone Encounter (Signed)
Spoke with patient, informed her that I would reach out to her on Tuesday 07/19/2022 to go over pre-meds and send in the prescriptions. I informed patient I did not want her going through the weekend not knowing we were aware of her call. Patient verbalized understanding.

## 2022-07-19 MED ORDER — PREDNISONE 20 MG PO TABS
ORAL_TABLET | ORAL | 0 refills | Status: DC
Start: 2022-07-19 — End: 2023-03-24

## 2022-07-19 MED ORDER — MONTELUKAST SODIUM 10 MG PO TABS: ORAL_TABLET | ORAL | 0 refills | Status: DC

## 2022-07-19 MED ORDER — FAMOTIDINE 20 MG PO TABS: ORAL_TABLET | ORAL | 0 refills | Status: DC

## 2022-07-19 NOTE — Telephone Encounter (Signed)
Error

## 2022-07-19 NOTE — Telephone Encounter (Signed)
Spoke with patient, went over criteria and premedication instructions. Patient verbalized understanding and would like prescriptions sent to the [facility] pharmacy. I informed patient that if she had any issues getting the prescriptions to call the office.

## 2022-07-29 ENCOUNTER — Ambulatory Visit (INDEPENDENT_AMBULATORY_CARE_PROVIDER_SITE_OTHER): Payer: No Typology Code available for payment source | Admitting: Allergy

## 2022-07-29 ENCOUNTER — Other Ambulatory Visit: Payer: Self-pay

## 2022-07-29 ENCOUNTER — Encounter: Payer: Self-pay | Admitting: Allergy

## 2022-07-29 VITALS — BP 110/74 | HR 88 | Temp 98.0°F | Resp 18 | Ht 62.6 in | Wt 163.8 lb

## 2022-07-29 DIAGNOSIS — J3089 Other allergic rhinitis: Secondary | ICD-10-CM

## 2022-07-29 DIAGNOSIS — J302 Other seasonal allergic rhinitis: Secondary | ICD-10-CM

## 2022-07-29 DIAGNOSIS — H1013 Acute atopic conjunctivitis, bilateral: Secondary | ICD-10-CM

## 2022-07-29 NOTE — Progress Notes (Signed)
RAPID DESENSITIZATION Note  RE: Destiny Lewis MRN: [id number] DOB: 12/18/1960 Date of Office Visit: 07/29/2022  Subjective:  Patient presents today for rapid desensitization.  Interval History: Patient has not been ill, she has taken all premedications as per protocol.  Recent/Current History: Pulmonary disease: no Cardiac disease: no Respiratory infection: no Rash: no Itch: no Swelling: no Cough: no Shortness of breath: no Runny/stuffy nose: no Itchy eyes: no Beta-blocker use: no  Patient/guardian was informed of the procedure with verbalized understanding of the risk of anaphylaxis. Consent has been signed.   Medication List:  Current Outpatient Medications  Medication Sig Dispense Refill   cholecalciferol (VITAMIN D) 1000 units tablet Take 1,000 Units by mouth daily.     cyclobenzaprine (FLEXERIL) 10 MG tablet Take 1 tablet (10 mg total) by mouth at bedtime as needed for muscle spasms. 30 tablet 5   cycloSPORINE (RESTASIS) 0.05 % ophthalmic emulsion Place 1 drop into both eyes 2 (two) times daily. 1.5 mL 1   EPINEPHrine (EPIPEN 2-PAK) 0.3 mg/0.3 mL IJ SOAJ injection Inject 0.3 mg into the muscle as needed for anaphylaxis. 2 each 2   famotidine (PEPCID) 20 MG tablet Take 20 mg the morning and evening before Rush and 20 mg the morning and evening of Rush Immunotherapy. 4 tablet 0   FLUoxetine (PROZAC) 20 MG capsule Take one capsule (20 mg dose) by mouth daily. 90 capsule 3   FLUoxetine (PROZAC) 20 MG capsule Take one capsule (20 mg dose) by mouth daily. 90 capsule 3   fluticasone (FLONASE) 50 MCG/ACT nasal spray Place 2 sprays into both nostrils daily. 11 g 11   levocetirizine (XYZAL) 5 MG tablet Take 1 tablet (5 mg total) by mouth every evening. 90 tablet 3   levocetirizine (XYZAL) 5 MG tablet Take 1 tablet (5 mg total) by mouth every evening. 90 tablet 3   magnesium gluconate (MAGONATE) 500 MG tablet Take 500 mg by mouth 2 (two) times daily.     meloxicam (MOBIC) 7.5 MG  tablet Take 1 tablet (7.5 mg total) by mouth daily. 10 tablet 0   methocarbamol (ROBAXIN) 500 MG tablet Take 1 tablet (500 mg total) by mouth 2 (two) times daily. 20 tablet 0   montelukast (SINGULAIR) 10 MG tablet Take 10 mg tablet the morning before and the morning of [facility] Immunotherapy. 2 tablet 0   Multiple Vitamin (MULTIVITAMIN) tablet Take 1 tablet by mouth daily.     olopatadine (PATANOL) 0.1 % ophthalmic solution INSTILL 1 DROP IN EACH EYE TWICE A DAY FOR ALLERGIES 5 mL 11   predniSONE (DELTASONE) 20 MG tablet Take 40 mg the morning before and 40 mg the morning of [facility] Immunotherapy. 4 tablet 0   RESTASIS 0.05 % ophthalmic emulsion Instill 1 drop in both eyes twice daily as directed 180 each 3   valACYclovir (VALTREX) 500 MG tablet Take one tablet (500 mg dose) by mouth as needed. 14 tablet 5   valACYclovir (VALTREX) 500 MG tablet Take one tablet (500 mg dose) by mouth as needed. 14 tablet 5   No current facility-administered medications for this visit.   Allergies: Allergies  Allergen Reactions   Azithromycin Other (See Comments)    Blisters    I reviewed her past medical history, social history, family history, and environmental history and no significant changes have been reported from her previous visit.  ROS: Negative except as per HPI.  Objective: BP 120/78   Pulse 82   Temp 98 F (36.7 C)   Resp  18   Ht 5' 2.6" (1.59 m)   Wt 163 lb 12.8 oz (74.3 kg)   LMP 01/26/2015 (Approximate)   SpO2 95%   BMI 29.39 kg/m  Body mass index is 29.39 kg/m.   General Appearance:  Alert, cooperative, no distress, appears stated age  Head:  Normocephalic, without obvious abnormality, atraumatic  Eyes:  Conjunctiva clear, EOM's intact  Nose: Nares normal  Throat: Lips, tongue normal; teeth and gums normal, normal posterior oropharnyx  Neck: Supple, symmetrical  Lungs:   CTAB, Respirations unlabored, no coughing  Heart:  Appears well perfused  Extremities: No edema  Skin: Skin  color, texture, turgor normal, no rashes or lesions on visualized portions of skin  Neurologic: No gross deficits     Diagnostics:  PROCEDURES:  Patient received the following doses every hour: Step 1:  0.62ml - 1:1,000,000 dilution (silver vial) Step 2:  0.35ml - 1:1,000,000 dilution (silver vial) Step 3: 0.5ml - 1:100,000 dilution (blue vial)      Pt had itching only of upper body which she states she can have at home for now reason.  No hives/rash.  Zyrtec given and protocol continued.  Step 4: 0.77ml - 1:100,000 dilution (blue vial)  Step 5: 0.37ml - 1:10,000 dilution (gold vial) Step 6: 0.67ml - 1:10,000 dilution (gold vial) Step 7: 0.86ml - 1:10,000 dilution (gold vial) Step 8: 0.45ml - 1:10,000 dilution (gold vial)  Patient was observed for 1 hour after the last dose.   She had resolution of itching after zyrtec dose and tolerated rest of the protocol without issue.    Procedure started at 845a Procedure ended at 345p   ASSESSMENT/PLAN:   Patient has tolerated the rapid desensitization protocol.  Next appointment: Start at 0.75ml of 1:1000 dilution (green vial) and build up per protocol.  Margo Aye, MD [facility]

## 2022-08-05 ENCOUNTER — Ambulatory Visit (INDEPENDENT_AMBULATORY_CARE_PROVIDER_SITE_OTHER): Payer: No Typology Code available for payment source

## 2022-08-05 DIAGNOSIS — J309 Allergic rhinitis, unspecified: Secondary | ICD-10-CM | POA: Diagnosis not present

## 2022-08-11 ENCOUNTER — Ambulatory Visit (INDEPENDENT_AMBULATORY_CARE_PROVIDER_SITE_OTHER): Payer: No Typology Code available for payment source

## 2022-08-11 DIAGNOSIS — J309 Allergic rhinitis, unspecified: Secondary | ICD-10-CM

## 2022-08-18 ENCOUNTER — Ambulatory Visit (INDEPENDENT_AMBULATORY_CARE_PROVIDER_SITE_OTHER): Payer: No Typology Code available for payment source

## 2022-08-18 DIAGNOSIS — J309 Allergic rhinitis, unspecified: Secondary | ICD-10-CM

## 2022-08-24 ENCOUNTER — Ambulatory Visit (INDEPENDENT_AMBULATORY_CARE_PROVIDER_SITE_OTHER): Payer: No Typology Code available for payment source | Admitting: *Deleted

## 2022-08-24 DIAGNOSIS — J309 Allergic rhinitis, unspecified: Secondary | ICD-10-CM | POA: Diagnosis not present

## 2022-09-01 ENCOUNTER — Ambulatory Visit (INDEPENDENT_AMBULATORY_CARE_PROVIDER_SITE_OTHER): Payer: No Typology Code available for payment source | Admitting: *Deleted

## 2022-09-01 DIAGNOSIS — J309 Allergic rhinitis, unspecified: Secondary | ICD-10-CM

## 2022-09-08 ENCOUNTER — Ambulatory Visit (INDEPENDENT_AMBULATORY_CARE_PROVIDER_SITE_OTHER): Payer: No Typology Code available for payment source

## 2022-09-08 DIAGNOSIS — J309 Allergic rhinitis, unspecified: Secondary | ICD-10-CM

## 2022-09-16 ENCOUNTER — Ambulatory Visit (INDEPENDENT_AMBULATORY_CARE_PROVIDER_SITE_OTHER): Payer: No Typology Code available for payment source | Admitting: *Deleted

## 2022-09-16 DIAGNOSIS — J309 Allergic rhinitis, unspecified: Secondary | ICD-10-CM

## 2022-09-23 ENCOUNTER — Ambulatory Visit (INDEPENDENT_AMBULATORY_CARE_PROVIDER_SITE_OTHER): Payer: No Typology Code available for payment source

## 2022-09-23 DIAGNOSIS — J309 Allergic rhinitis, unspecified: Secondary | ICD-10-CM | POA: Diagnosis not present

## 2022-09-29 ENCOUNTER — Ambulatory Visit: Payer: Self-pay

## 2022-09-29 ENCOUNTER — Ambulatory Visit (INDEPENDENT_AMBULATORY_CARE_PROVIDER_SITE_OTHER): Payer: No Typology Code available for payment source

## 2022-09-29 DIAGNOSIS — J309 Allergic rhinitis, unspecified: Secondary | ICD-10-CM | POA: Diagnosis not present

## 2022-10-07 ENCOUNTER — Ambulatory Visit (INDEPENDENT_AMBULATORY_CARE_PROVIDER_SITE_OTHER): Payer: No Typology Code available for payment source

## 2022-10-07 DIAGNOSIS — J309 Allergic rhinitis, unspecified: Secondary | ICD-10-CM

## 2022-10-14 ENCOUNTER — Ambulatory Visit (INDEPENDENT_AMBULATORY_CARE_PROVIDER_SITE_OTHER): Payer: No Typology Code available for payment source | Admitting: *Deleted

## 2022-10-14 DIAGNOSIS — J309 Allergic rhinitis, unspecified: Secondary | ICD-10-CM | POA: Diagnosis not present

## 2022-10-20 ENCOUNTER — Ambulatory Visit (INDEPENDENT_AMBULATORY_CARE_PROVIDER_SITE_OTHER): Payer: No Typology Code available for payment source

## 2022-10-20 DIAGNOSIS — J309 Allergic rhinitis, unspecified: Secondary | ICD-10-CM | POA: Diagnosis not present

## 2022-10-26 ENCOUNTER — Ambulatory Visit (INDEPENDENT_AMBULATORY_CARE_PROVIDER_SITE_OTHER): Payer: No Typology Code available for payment source

## 2022-10-26 DIAGNOSIS — J309 Allergic rhinitis, unspecified: Secondary | ICD-10-CM

## 2022-11-03 ENCOUNTER — Ambulatory Visit (INDEPENDENT_AMBULATORY_CARE_PROVIDER_SITE_OTHER): Payer: No Typology Code available for payment source

## 2022-11-03 DIAGNOSIS — J309 Allergic rhinitis, unspecified: Secondary | ICD-10-CM

## 2022-11-09 ENCOUNTER — Ambulatory Visit (INDEPENDENT_AMBULATORY_CARE_PROVIDER_SITE_OTHER): Payer: No Typology Code available for payment source | Admitting: *Deleted

## 2022-11-09 DIAGNOSIS — J309 Allergic rhinitis, unspecified: Secondary | ICD-10-CM | POA: Diagnosis not present

## 2022-11-16 ENCOUNTER — Ambulatory Visit (INDEPENDENT_AMBULATORY_CARE_PROVIDER_SITE_OTHER): Payer: No Typology Code available for payment source

## 2022-11-16 DIAGNOSIS — J309 Allergic rhinitis, unspecified: Secondary | ICD-10-CM

## 2022-11-23 ENCOUNTER — Ambulatory Visit (INDEPENDENT_AMBULATORY_CARE_PROVIDER_SITE_OTHER): Payer: No Typology Code available for payment source | Admitting: *Deleted

## 2022-11-23 DIAGNOSIS — J309 Allergic rhinitis, unspecified: Secondary | ICD-10-CM

## 2022-11-30 ENCOUNTER — Ambulatory Visit (INDEPENDENT_AMBULATORY_CARE_PROVIDER_SITE_OTHER): Payer: No Typology Code available for payment source | Admitting: *Deleted

## 2022-11-30 DIAGNOSIS — J309 Allergic rhinitis, unspecified: Secondary | ICD-10-CM | POA: Diagnosis not present

## 2022-12-05 DIAGNOSIS — J3089 Other allergic rhinitis: Secondary | ICD-10-CM | POA: Diagnosis not present

## 2022-12-05 NOTE — Progress Notes (Signed)
VIALS EXP 12-05-23

## 2022-12-06 DIAGNOSIS — J3081 Allergic rhinitis due to animal (cat) (dog) hair and dander: Secondary | ICD-10-CM | POA: Diagnosis not present

## 2022-12-07 ENCOUNTER — Ambulatory Visit (INDEPENDENT_AMBULATORY_CARE_PROVIDER_SITE_OTHER): Payer: No Typology Code available for payment source | Admitting: *Deleted

## 2022-12-07 DIAGNOSIS — J309 Allergic rhinitis, unspecified: Secondary | ICD-10-CM

## 2022-12-14 ENCOUNTER — Ambulatory Visit (INDEPENDENT_AMBULATORY_CARE_PROVIDER_SITE_OTHER): Payer: No Typology Code available for payment source | Admitting: *Deleted

## 2022-12-14 DIAGNOSIS — J309 Allergic rhinitis, unspecified: Secondary | ICD-10-CM | POA: Diagnosis not present

## 2022-12-20 ENCOUNTER — Encounter: Payer: Self-pay | Admitting: Neurology

## 2022-12-21 ENCOUNTER — Ambulatory Visit (INDEPENDENT_AMBULATORY_CARE_PROVIDER_SITE_OTHER): Payer: No Typology Code available for payment source | Admitting: *Deleted

## 2022-12-21 DIAGNOSIS — J309 Allergic rhinitis, unspecified: Secondary | ICD-10-CM

## 2022-12-28 ENCOUNTER — Ambulatory Visit (INDEPENDENT_AMBULATORY_CARE_PROVIDER_SITE_OTHER): Payer: No Typology Code available for payment source | Admitting: *Deleted

## 2022-12-28 DIAGNOSIS — J309 Allergic rhinitis, unspecified: Secondary | ICD-10-CM | POA: Diagnosis not present

## 2022-12-29 ENCOUNTER — Encounter: Payer: Self-pay | Admitting: Internal Medicine

## 2023-01-04 ENCOUNTER — Ambulatory Visit (INDEPENDENT_AMBULATORY_CARE_PROVIDER_SITE_OTHER): Payer: No Typology Code available for payment source | Admitting: *Deleted

## 2023-01-04 DIAGNOSIS — J309 Allergic rhinitis, unspecified: Secondary | ICD-10-CM

## 2023-01-13 ENCOUNTER — Ambulatory Visit (INDEPENDENT_AMBULATORY_CARE_PROVIDER_SITE_OTHER): Payer: No Typology Code available for payment source

## 2023-01-13 DIAGNOSIS — J309 Allergic rhinitis, unspecified: Secondary | ICD-10-CM

## 2023-02-20 ENCOUNTER — Ambulatory Visit (INDEPENDENT_AMBULATORY_CARE_PROVIDER_SITE_OTHER): Payer: No Typology Code available for payment source | Admitting: *Deleted

## 2023-02-20 DIAGNOSIS — J309 Allergic rhinitis, unspecified: Secondary | ICD-10-CM

## 2023-03-23 NOTE — Progress Notes (Signed)
NEUROLOGY CONSULTATION NOTE  Destiny Lewis MRN: [id number] DOB: [birth year]  Referring provider: Lucky Cowboy, MD Primary care provider: [facility]  Reason for consult:  headaches  Assessment/Plan:   Migraine without aura, without status migrainosus, not intractable - likely improved by discontinuing/overusing Tylenol  Migraine prevention:  not indicated.  Just lifestyle modification Migraine rescue:  Excedrin Limit use of pain relievers to no more than 10 days out of the week to prevent risk of rebound or medication-overuse headache. Keep headache diary Follow up 6 months.    Subjective:  Destiny Lewis is a 63 year old fright-handed emale with chronic rhinosinusitis and MDD who presents for headaches.  History supplemented by referring provider's note.  Onset:  around 2022 Location:  periorbital unilateral or bilateral Quality:  pressure, throbbing Intensity:  8/10.   Aura:  absent Prodrome:  absent Associated symptoms:  photophobia, phonophobia.  She denies associated nausea, vomiting, visual disturbance, unilateral numbness or weakness. Duration:  all day (1 hour with Excedrin) Frequency:  daily prior to the Fall.  Told to stop taking daily Tylenol Sinus.  Since then, frequency varies from few times a week to few times a month.  She had 3 in past 4 weeks. Sometimes she wakes up with the headache. Frequency of abortive medication: Excedrin only when she has a headache Triggers:  Unsure.  Possibly wine Relieving factors:  Excedrin Activity:  aggravates.  10/05/2022 CT SINUSES WO:  No evidence for sinus disease   She has history of migraines, which is associated with nausea and photophobia.  More severe.  Retrorbital.  They occur once or twice a year.  Past NSAIDS/analgesics:  meloxicam, Tylenol Sinus, Motrin, Tyenol Past abortive triptans:  none Past abortive ergotamine:  none Past muscle relaxants:  none Past anti-emetic:  none Past antihypertensive  medications:  none Past antidepressant medications:  Wellbutrin (dizziness) Past anticonvulsant medications:  none Past anti-CGRP:  none Past antihistamines/decongestants:  cetirizine, fexofenadine, azelastine Other past therapies:  none  Current NSAIDS/analgesics:  Excedrin Migraine, Tylenol Sinus Current triptans:  none Current ergotamine:  none Current anti-emetic:  none Current muscle relaxants:  none Current Antihypertensive medications:  none Current Antidepressant medications:  fluoxetine 20mg  daily Current Anticonvulsant medications:  none Current anti-CGRP:  none Current Vitamins/Herbal/Supplements:  magnesium oxide 400mg  daily, MVI Current Antihistamines/Decongestants:  Xyzal, Flonase, levocetirizine Other therapy:  none   Caffeine:  2 cups of coffee daily.  Tea.  No soda Alcohol:  occasional Smoker:  never Diet:  50 oz water daily.  Sometimes may skip lunch Exercise:  pilates Depression:  stable; Anxiety:  stable Other pain:  limited range of motion in neck, some left shoulder discomfort  Sleep hygiene:  Tries to get 9 hours a night.  Interrupted.  Sleeps with her dog.  Usually no excessive daytime sleepiness.  Prior sleep study positive for mild OSA. Could not tolerate CPAP. Family history of headache:  no      PAST MEDICAL HISTORY: Past Medical History:  Diagnosis Date   Allergy    Colon polyps    Constipation    uses figs and that helps    High cholesterol    History of colon polyps    Hormone disorder    Hx of ovarian cyst    Peptic ulcer     PAST SURGICAL HISTORY: Past Surgical History:  Procedure Laterality Date   BUNIONECTOMY Bilateral    COLONOSCOPY     OSTEOCHONDROMA EXCISION     POLYPECTOMY     UPPER GASTROINTESTINAL  ENDOSCOPY     WISDOM TOOTH EXTRACTION      MEDICATIONS: Current Outpatient Medications on File Prior to Visit  Medication Sig Dispense Refill   cholecalciferol (VITAMIN D) 1000 units tablet Take 1,000 Units by mouth  daily.     cyclobenzaprine (FLEXERIL) 10 MG tablet Take 1 tablet (10 mg total) by mouth at bedtime as needed for muscle spasms. 30 tablet 5   cycloSPORINE (RESTASIS) 0.05 % ophthalmic emulsion Place 1 drop into both eyes 2 (two) times daily. 1.5 mL 1   EPINEPHrine (EPIPEN 2-PAK) 0.3 mg/0.3 mL IJ SOAJ injection Inject 0.3 mg into the muscle as needed for anaphylaxis. 2 each 2   famotidine (PEPCID) 20 MG tablet Take 20 mg the morning and evening before Rush and 20 mg the morning and evening of [facility] Immunotherapy. 4 tablet 0   FLUoxetine (PROZAC) 20 MG capsule Take one capsule (20 mg dose) by mouth daily. 90 capsule 3   FLUoxetine (PROZAC) 20 MG capsule Take one capsule (20 mg dose) by mouth daily. 90 capsule 3   fluticasone (FLONASE) 50 MCG/ACT nasal spray Place 2 sprays into both nostrils daily. 11 g 11   levocetirizine (XYZAL) 5 MG tablet Take 1 tablet (5 mg total) by mouth every evening. 90 tablet 3   levocetirizine (XYZAL) 5 MG tablet Take 1 tablet (5 mg total) by mouth every evening. 90 tablet 3   magnesium gluconate (MAGONATE) 500 MG tablet Take 500 mg by mouth 2 (two) times daily.     meloxicam (MOBIC) 7.5 MG tablet Take 1 tablet (7.5 mg total) by mouth daily. 10 tablet 0   methocarbamol (ROBAXIN) 500 MG tablet Take 1 tablet (500 mg total) by mouth 2 (two) times daily. 20 tablet 0   montelukast (SINGULAIR) 10 MG tablet Take 10 mg tablet the morning before and the morning of Rush Immunotherapy. 2 tablet 0   Multiple Vitamin (MULTIVITAMIN) tablet Take 1 tablet by mouth daily.     olopatadine (PATANOL) 0.1 % ophthalmic solution INSTILL 1 DROP IN EACH EYE TWICE A DAY FOR ALLERGIES 5 mL 11   predniSONE (DELTASONE) 20 MG tablet Take 40 mg the morning before and 40 mg the morning of Rush Immunotherapy. 4 tablet 0   RESTASIS 0.05 % ophthalmic emulsion Instill 1 drop in both eyes twice daily as directed 180 each 3   valACYclovir (VALTREX) 500 MG tablet Take one tablet (500 mg dose) by mouth as needed.  14 tablet 5   valACYclovir (VALTREX) 500 MG tablet Take one tablet (500 mg dose) by mouth as needed. 14 tablet 5   No current facility-administered medications on file prior to visit.    ALLERGIES: Allergies  Allergen Reactions   Azithromycin Other (See Comments)    Blisters     FAMILY HISTORY: Family History  Problem Relation Age of Onset   Skin cancer Mother    Heart attack Mother    Hypertension Mother    Arthritis Mother    Diabetes Mother    Colon cancer Father    COPD Father    Allergic rhinitis Sister    Breast cancer Sister    Hypertension Sister    Allergic rhinitis Sister    Hashimoto's thyroiditis Sister    Hypertension Brother    Colon polyps Brother    Heart attack Brother    Colon polyps Brother    Breast cancer Maternal Aunt    Colon cancer Maternal Grandmother    Esophageal cancer Neg Hx    Rectal  cancer Neg Hx    Stomach cancer Neg Hx     Objective:  Blood pressure 121/76, pulse 73, height 5\' 3"  (1.6 m), weight 163 lb (73.9 kg), last menstrual period 01/26/2015, SpO2 97%. General: No acute distress.  Patient appears well-groomed.   Head:  Normocephalic/atraumatic Eyes:  fundi examined but not visualized Neck: supple, no paraspinal tenderness, slightly less range of motion to right head turn Heart: regular rate and rhythm Neurological Exam: Mental status: alert and oriented to person, place, and time, speech fluent and not dysarthric, language intact. Cranial nerves: CN I: not tested CN II: pupils equal, round and reactive to light, visual fields intact CN III, IV, VI:  full range of motion, no nystagmus, no ptosis CN V: facial sensation intact. CN VII: upper and lower face symmetric CN VIII: hearing intact CN IX, X: gag intact, uvula midline CN XI: sternocleidomastoid and trapezius muscles intact CN XII: tongue midline Bulk & Tone: normal, no fasciculations. Motor:  muscle strength 5/5 throughout Sensation:  Pinprick and vibratory sensation  intact. Deep Tendon Reflexes:  2+ throughout,  toes downgoing.   Finger to nose testing:  Without dysmetria.  .   Gait:  Normal station and stride.  Romberg negative.    Thank you for allowing me to take part in the care of this patient.  Shon Millet, DO  CC: Lucky Cowboy, MD

## 2023-03-24 ENCOUNTER — Ambulatory Visit (INDEPENDENT_AMBULATORY_CARE_PROVIDER_SITE_OTHER): Payer: No Typology Code available for payment source | Admitting: Neurology

## 2023-03-24 ENCOUNTER — Ambulatory Visit (INDEPENDENT_AMBULATORY_CARE_PROVIDER_SITE_OTHER): Payer: No Typology Code available for payment source | Admitting: *Deleted

## 2023-03-24 ENCOUNTER — Encounter: Payer: Self-pay | Admitting: Neurology

## 2023-03-24 VITALS — BP 121/76 | HR 73 | Ht 63.0 in | Wt 163.0 lb

## 2023-03-24 DIAGNOSIS — J309 Allergic rhinitis, unspecified: Secondary | ICD-10-CM

## 2023-03-24 DIAGNOSIS — G43109 Migraine with aura, not intractable, without status migrainosus: Secondary | ICD-10-CM | POA: Diagnosis not present

## 2023-03-24 NOTE — Patient Instructions (Signed)
  Limit use of pain relievers to no more than 10 days out of the month.  These medications include acetaminophen, NSAIDs (ibuprofen/Advil/Motrin, naproxen/Aleve, triptans (Imitrex/sumatriptan), Excedrin, and narcotics.  This will help reduce risk of rebound headaches. Be aware of common food triggers Routine exercise Stay adequately hydrated (aim for 64 oz water daily) Keep headache diary Maintain proper stress management Maintain proper sleep hygiene Do not skip meals Consider supplements:  magnesium citrate 400mg  daily, riboflavin 400mg  daily, coenzyme Q10 300mg  daily

## 2023-04-19 ENCOUNTER — Ambulatory Visit (INDEPENDENT_AMBULATORY_CARE_PROVIDER_SITE_OTHER): Payer: No Typology Code available for payment source | Admitting: *Deleted

## 2023-04-19 DIAGNOSIS — J309 Allergic rhinitis, unspecified: Secondary | ICD-10-CM

## 2023-04-27 ENCOUNTER — Ambulatory Visit: Admitting: Allergy & Immunology

## 2023-04-27 ENCOUNTER — Encounter: Payer: Self-pay | Admitting: Allergy & Immunology

## 2023-04-27 ENCOUNTER — Other Ambulatory Visit (HOSPITAL_COMMUNITY): Payer: Self-pay

## 2023-04-27 ENCOUNTER — Other Ambulatory Visit: Payer: Self-pay

## 2023-04-27 VITALS — BP 110/80 | HR 76 | Temp 98.4°F | Ht 63.0 in | Wt 163.0 lb

## 2023-04-27 DIAGNOSIS — J302 Other seasonal allergic rhinitis: Secondary | ICD-10-CM | POA: Diagnosis not present

## 2023-04-27 DIAGNOSIS — J3089 Other allergic rhinitis: Secondary | ICD-10-CM | POA: Diagnosis not present

## 2023-04-27 MED ORDER — PREDNISONE 10 MG PO TABS
ORAL_TABLET | ORAL | 0 refills | Status: DC
  Filled 2023-04-27: qty 18, 6d supply, fill #0

## 2023-04-27 NOTE — Patient Instructions (Addendum)
 1. Seasonal and perennial allergic rhinitis - We are going to remix your vials to increase the tree, cat, and dog. - We are adding epinephrine rinses to your injections. - Start a prednisone taper that I sent in. - If you can get into ENT for a sinus CT in a short time frame, DO NOT start the prednisone until after the CT scan. - Stop Flonase and start Xhance one spray per nostril twice daily. - Demonstration provided. - CALL us MONDAY with an update!   2. Return in about 3 months (around 07/28/2023). You can have the follow up appointment with Dr. Dellis Anes or a Nurse Practicioner (our Nurse Practitioners are excellent and always have Physician oversight!).    Please inform us of any Emergency Department visits, hospitalizations, or changes in symptoms. Call us before going to the ED for breathing or allergy symptoms since we might be able to fit you in for a sick visit. Feel free to contact us anytime with any questions, problems, or concerns.  It was a pleasure to meet you today!  Websites that have reliable patient information: 1. American Academy of Asthma, Allergy, and Immunology: www.aaaai.org 2. Food Allergy Research and Education (FARE): foodallergy.org 3. Mothers of Asthmatics: http://www.asthmacommunitynetwork.org 4. American College of Allergy, Asthma, and Immunology: www.acaai.org      "Like" [place] on Facebook and Instagram for our latest updates!      A healthy democracy works best when [employer] participate! Make sure you are registered to vote! If you have moved or changed any of your contact information, you will need to get this updated before voting! Scan the QR codes below to learn more!

## 2023-04-27 NOTE — Progress Notes (Signed)
 FOLLOW UP  Date of Service/Encounter:  04/27/23   Assessment:   Perennial seasonal allergic rhinitis (trees, molds, dust mites, cat, dog, cockroach)  Sinusitis - likely allergic (starting prednisone)  Plan/Recommendations:   1. Seasonal and perennial allergic rhinitis - We are going to remix your vials to increase the tree, cat, and dog. - We are adding epinephrine rinses to your injections. - Start a prednisone taper that I sent in. - If you can get into ENT for a sinus CT in a short time frame, DO NOT start the prednisone until after the CT scan. - Stop Flonase and start Xhance one spray per nostril twice daily. - Demonstration provided. - CALL us MONDAY with an update!   2. Return in about 3 months (around 07/28/2023). You can have the follow up appointment with Dr. Dellis Anes or a Nurse Practicioner (our Nurse Practitioners are excellent and always have Physician oversight!).   Subjective:   Charmeka Freeburg is a 63 y.o. female presenting today for follow up of  Chief Complaint  Patient presents with   Cough   Nasal Congestion    Watery eyes post nasal drip sneezing runny nose pain and pressure in face    Kyra Laffey has a history of the following: Patient Active Problem List   Diagnosis Date Noted   Seasonal and perennial allergic rhinitis 06/22/2022   Allergic conjunctivitis of both eyes 06/22/2022   Allergic rhinitis 06/21/2018   Pure hypercholesterolemia 06/21/2018   Family history of breast cancer in sister 06/21/2018   History of depression 06/21/2018   Vaginal dryness 06/21/2018   Family history of colon cancer in father 12/04/2017   Hot flashes 06/15/2017   Hx of adenomatous colonic polyps 08/13/2015    History obtained from: chart review and patient.  Discussed the use of AI scribe software for clinical note transcription with the patient and/or guardian, who gave verbal consent to proceed.  Jermika is a 63 y.o. female presenting for a follow up  visit.  She was last seen in June 2024 by Dr. Delorse Lek.  At that time, she underwent rush immunotherapy.  I have never met the patient, but she is an absolute delight!  She experiences nasal congestion, clear rhinorrhea, sneezing, and nasopharyngeal congestion, which acutely worsened after her last injection, beginning with a scratchy throat. Her symptoms are typically worse in the spring and fall.  She has been on maintenance immunotherapy for approximately seven months, having started in June of the previous year, and recently transitioned to monthly injections. She experiences significant immediate reactions to the mold component of her shots, describing them as 'big and hot and red.' Current nasal sprays, including Flonase and ipratropium, are ineffective.  She experiences sinus pain and pressure across her face and forehead, though she does not frequently have sinus infections. Her nasal discharge remains clear, unlike the purulent discharge she associates with infections. A CT scan of her sinuses from the previous year showed some thickening but was otherwise normal. She has not undergone sinus surgery. She has seen an ENT through the [place], who advised her to return during a flare-up for further evaluation.  She has a history of exposure to animals, currently having one dog and one cat, both of which shed significantly. No recent viral illnesses in her family.  Evea is on allergen immunotherapy. She receives two injections. Immunotherapy script #1 contains trees, cat, and dog. She currently receives 0.68mL of the RED vial (1/100). Immunotherapy script #2 contains molds, dust mites, and cockroach.  She currently receives 0.1mL of the RED vial (1/100). She started shots June of 2024 and reached maintenance in July of 2024.  In her retirement, she stays active by working out and spending time with her two grandchildren. 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     Otherwise, there have been no changes to her past medical history, surgical history, family history, or social history.    Review of systems otherwise negative other than that mentioned in the HPI.    Objective:   Blood pressure 110/80, pulse 76, temperature 98.4 F (36.9 C), height 5\' 3"  (1.6 m), weight 163 lb (73.9 kg), last menstrual period 01/26/2015, SpO2 95%. Body mass index is 28.87 kg/m.    Physical Exam Vitals reviewed.  Constitutional:      Appearance: She is well-developed.  HENT:     Head: Normocephalic and atraumatic.     Right Ear: Tympanic membrane, ear canal and external ear normal.     Left Ear: Tympanic membrane, ear canal and external ear normal.     Nose: Mucosal edema and rhinorrhea present. No nasal deformity or septal deviation.     Right Turbinates: Enlarged, swollen and pale.     Left Turbinates: Enlarged, swollen and pale.     Right Sinus: No maxillary sinus tenderness or frontal sinus tenderness.     Left Sinus: No maxillary sinus tenderness or frontal sinus tenderness.     Mouth/Throat:     Mouth: Mucous membranes are not pale and not dry.     Pharynx: Uvula midline.  Eyes:     General: Lids are normal. No allergic shiner.       Right eye: No discharge.        Left eye: No discharge.     Conjunctiva/sclera: Conjunctivae normal.     Right eye: Right conjunctiva is not injected. No chemosis.    Left eye: Left conjunctiva is not injected. No chemosis.    Pupils: Pupils are equal, round, and reactive to light.  Cardiovascular:     Rate and Rhythm: Normal rate and regular rhythm.     Heart sounds: Normal heart sounds.  Pulmonary:     Effort: Pulmonary effort is normal. No tachypnea, accessory muscle usage or respiratory distress.     Breath sounds: Normal breath sounds. No wheezing, rhonchi or rales.  Chest:     Chest wall: No tenderness.   Lymphadenopathy:     Cervical: No cervical adenopathy.  Skin:    Coloration: Skin is not pale.     Findings: No abrasion, erythema, petechiae or rash. Rash is not papular, urticarial or vesicular.  Neurological:     Mental Status: She is alert.  Psychiatric:        Behavior: Behavior is cooperative.      Diagnostic studies: none    Malachi Bonds, MD  [facility]

## 2023-04-28 NOTE — Progress Notes (Signed)
 Aeroallergen Immunotherapy (**NOTE NEW SCRIPT**)  Ordering Provider: Dr. Malachi Bonds  Patient Details Name: Destiny Lewis MRN: [id number] Date of Birth: 1960-03-01  Order 2 of 2  Vial Label: T/D/C  0.8 ml (Volume)  1:20 Concentration -- Eastern 10 Tree Mix (also Sweet Gum) 1.0 ml (Volume)  1:10 Concentration -- Cat Hair 1.0 ml (Volume)  1:10 Concentration -- Dog Epithelia   2.8  ml Extract Subtotal 2.2  ml Diluent 5.0  ml Maintenance Total  Schedule: A  Red Vial (1:100): Schedule A (11 doses)  Special Instructions: Start new vial on Schedule A, starting with Red 0.092mL. Advance on Schedule A. After completion of the first Red Vial, resume previous schedule.

## 2023-04-28 NOTE — Progress Notes (Signed)
 EXP 04/30/24

## 2023-05-01 ENCOUNTER — Telehealth: Payer: Self-pay

## 2023-05-01 NOTE — Telephone Encounter (Signed)
 Patient called to give an update on how she is feeling after her office visit last week. Patient wanted to let Dr. Dellis Anes know she is doing much better. She will come in later this week to get her allergy injections.

## 2023-05-03 NOTE — Telephone Encounter (Signed)
That is so good to hear!   Destiny BondsJoel Almer Littleton, MD [facility]

## 2023-05-05 ENCOUNTER — Ambulatory Visit (INDEPENDENT_AMBULATORY_CARE_PROVIDER_SITE_OTHER): Payer: Self-pay | Admitting: *Deleted

## 2023-05-05 DIAGNOSIS — J309 Allergic rhinitis, unspecified: Secondary | ICD-10-CM | POA: Diagnosis not present

## 2023-05-19 ENCOUNTER — Ambulatory Visit (INDEPENDENT_AMBULATORY_CARE_PROVIDER_SITE_OTHER): Payer: Self-pay

## 2023-05-19 DIAGNOSIS — J309 Allergic rhinitis, unspecified: Secondary | ICD-10-CM

## 2023-06-01 ENCOUNTER — Telehealth: Payer: Self-pay | Admitting: Allergy & Immunology

## 2023-06-01 NOTE — Telephone Encounter (Signed)
 Spoke with pt and informed her that a new authorization has been sent in and to [facility] as [facility] for a [facility]

## 2023-06-02 ENCOUNTER — Encounter: Payer: Self-pay | Admitting: Allergy & Immunology

## 2023-06-12 NOTE — Telephone Encounter (Signed)
 Pt referral has been placed in chart

## 2023-07-27 ENCOUNTER — Encounter: Payer: Self-pay | Admitting: Allergy & Immunology

## 2023-07-27 ENCOUNTER — Ambulatory Visit (INDEPENDENT_AMBULATORY_CARE_PROVIDER_SITE_OTHER): Admitting: Allergy & Immunology

## 2023-07-27 ENCOUNTER — Other Ambulatory Visit: Payer: Self-pay

## 2023-07-27 VITALS — BP 124/70 | HR 70 | Temp 97.6°F | Resp 18 | Ht 62.21 in | Wt 158.9 lb

## 2023-07-27 DIAGNOSIS — J3089 Other allergic rhinitis: Secondary | ICD-10-CM

## 2023-07-27 DIAGNOSIS — J302 Other seasonal allergic rhinitis: Secondary | ICD-10-CM

## 2023-07-27 NOTE — Patient Instructions (Addendum)
 1. Seasonal and perennial allergic rhinitis - I am glad that you are doing well without the shots. - Continue with cetirizine  daily and Flonase  as needed.   2. Return if symptoms worsen or fail to improve.    Please inform us  of any Emergency Department visits, hospitalizations, or changes in symptoms. Call us  before going to the ED for breathing or allergy  symptoms since we might be able to fit you in for a sick visit. Feel free to contact us  anytime with any questions, problems, or concerns.  It was a pleasure to see you again today!  Websites that have reliable patient information: 1. American Academy of Asthma, Allergy , and Immunology: www.aaaai.org 2. Food Allergy  Research and Education (FARE): foodallergy.org 3. Mothers of Asthmatics: http://www.asthmacommunitynetwork.org 4. American College of Allergy , Asthma, and Immunology: www.acaai.org      "Like" us  on Facebook and Instagram for our latest updates!      A healthy democracy works best when [employer] participate! Make sure you are registered to vote! If you have moved or changed any of your contact information, you will need to get this updated before voting! Scan the QR codes below to learn more!

## 2023-07-27 NOTE — Progress Notes (Signed)
 FOLLOW UP  Date of Service/Encounter:  07/27/23   Assessment:   Perennial seasonal allergic rhinitis (trees, molds, dust mites, cat, dog, cockroach) - doing better since stopping the allergy  shots   Plan/Recommendations:   1. Seasonal and perennial allergic rhinitis - I am glad that you are doing well without the shots. - Continue with cetirizine  daily and Flonase  as needed.   2. Return if symptoms worsen or fail to improve.    Subjective:   Destiny Lewis is a 63 y.o. female presenting today for follow up of  Chief Complaint  Patient presents with   Establish Care   Allergies    Pt has stop allergy  injections and she states that she feels better since she has stopped.    Destiny Lewis has a history of the following: Patient Active Problem List   Diagnosis Date Noted   Seasonal and perennial allergic rhinitis 06/22/2022   Allergic conjunctivitis of both eyes 06/22/2022   Allergic rhinitis 06/21/2018   Pure hypercholesterolemia 06/21/2018   Family history of breast cancer in sister 06/21/2018   History of depression 06/21/2018   Vaginal dryness 06/21/2018   Family history of colon cancer in father 12/04/2017   Hot flashes 06/15/2017   Hx of adenomatous colonic polyps 08/13/2015    History obtained from: chart review and patient.  Discussed the use of AI scribe software for clinical note transcription with the patient and/or guardian, who gave verbal consent to proceed.  Destiny Lewis is a 63 y.o. female presenting for a follow up visit.  She was last seen in March 2025.  At that time, we decided to remix her allergy  vials to increase her allergens in the vials.  We also added epinephrine  rinses.  We started her on a prednisone  burst for sinusitis and sent her to ENT.  We changed out her Flonase  for Xhance .  Since last visit, she has done much better.  Allergic Rhinitis Symptom History: She experienced significant improvement in her symptoms after discontinuing allergy   injections, which had been causing severe reactions and were ineffective during pollen season. Since stopping the injections at the end of March, she has not been sick and feels much better. She uses Flonase  as needed and takes Zyrtec  daily. She did not notice any improvement with the previously provided nasal spray.  Destiny Lewis was on allergen immunotherapy. She receives two injections. Immunotherapy script #1 contains trees, cat, and dog. She currently receives 0.23mL of the RED vial (1/100). Immunotherapy script #2 contains molds, dust mites, and cockroach. She currently receives 0.47mL of the RED vial (1/100). She started shots June of 2024 and reached maintenance in July of 2024. She has stopped shots.   She has not seen an ENT specialist after her illness due [facility], although she had seen one prior to her illness.  She plans to travel to [place]  to visit family, and later to [facility] and [place] for her daughter's wedding renewal, as well as to [place], [place]  for another wedding.     In her retirement, she stays active by working out and spending time with her two grandchildren. 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     Otherwise, there have been no changes to her past medical history, surgical history, family history, or social history.    Review of systems otherwise negative other than that  mentioned in the HPI.    Objective:   Blood pressure 124/70, pulse 70, temperature 97.6 F (36.4 C), temperature source Temporal, resp. rate 18, height 5' 2.21" (1.58 m), weight 158 lb 14.4 oz (72.1 kg), last menstrual period 01/26/2015, SpO2 95%. Body mass index is 28.87 kg/m.    Physical Exam Vitals reviewed.  Constitutional:      Appearance: She is well-developed.     Comments: Pleasant. Moving air well in all lung  fields. No increased work of breathing noted.   HENT:     Head: Normocephalic and atraumatic.     Right Ear: Tympanic membrane, ear canal and external ear normal.     Left Ear: Tympanic membrane, ear canal and external ear normal.     Nose: Mucosal edema and rhinorrhea present. No nasal deformity or septal deviation.     Right Turbinates: Enlarged, swollen and pale.     Left Turbinates: Enlarged, swollen and pale.     Right Sinus: No maxillary sinus tenderness or frontal sinus tenderness.     Left Sinus: No maxillary sinus tenderness or frontal sinus tenderness.     Comments: No polyps noted.     Mouth/Throat:     Mouth: Mucous membranes are not pale and not dry.     Pharynx: Uvula midline.  Eyes:     General: Lids are normal. No allergic shiner.       Right eye: No discharge.        Left eye: No discharge.     Conjunctiva/sclera: Conjunctivae normal.     Right eye: Right conjunctiva is not injected. No chemosis.    Left eye: Left conjunctiva is not injected. No chemosis.    Pupils: Pupils are equal, round, and reactive to light.  Cardiovascular:     Rate and Rhythm: Normal rate and regular rhythm.     Heart sounds: Normal heart sounds.  Pulmonary:     Effort: Pulmonary effort is normal. No tachypnea, accessory muscle usage or respiratory distress.     Breath sounds: Normal breath sounds. No wheezing, rhonchi or rales.  Chest:     Chest wall: No tenderness.  Lymphadenopathy:     Cervical: No cervical adenopathy.  Skin:    Coloration: Skin is not pale.     Findings: No abrasion, erythema, petechiae or rash. Rash is not papular, urticarial or vesicular.  Neurological:     Mental Status: She is alert.  Psychiatric:        Behavior: Behavior is cooperative.      Diagnostic studies: none       Drexel Gentles, MD  [facility] 

## 2023-07-28 ENCOUNTER — Telehealth: Payer: Self-pay | Admitting: Allergy & Immunology

## 2023-07-28 NOTE — Telephone Encounter (Signed)
 [facility] [place] called to confirm 07/27/23 visit was completed, asked that visit notes/records be faxed to them at: [phone] ATTN: [facility]

## 2023-09-13 ENCOUNTER — Telehealth: Payer: Self-pay | Admitting: Neurology

## 2023-09-13 NOTE — Telephone Encounter (Signed)
 Good morning,    We will start working on August appointments this week and next, we'll get it to you prior to the appointment. Thanks!      --- Originally sent by [email or web address] on Sep 13, 2023 10:02 AM ---    This message was sent securely by  [facility].          Good Morning, I wanted to check the status of this referral Destiny Lewis 12/26/1960    [name], [facility] P) [phone] F) [phone]

## 2023-09-13 NOTE — Telephone Encounter (Signed)
 Pt. is telling us  that [facility]

## 2023-09-13 NOTE — Telephone Encounter (Signed)
 Referral sent 09/04/23. Status email sent today.

## 2023-09-21 NOTE — Progress Notes (Unsigned)
 NEUROLOGY FOLLOW UP OFFICE NOTE  Destiny Lewis [id number]  Assessment/Plan:   Migraine without aura, without status migrainosus, not intractable - likely improved by discontinuing/overusing Tylenol  Migraine prevention:  not indicated.  Just lifestyle modification Migraine rescue:  Excedrin Limit use of pain relievers to no more than 9 days out of the month to prevent risk of rebound or medication-overuse headache. Keep headache diary Follow up ***    Subjective:  Destiny Lewis is a 63 year old fright-handed emale with chronic rhinosinusitis and MDD who follows up for migraine.  UPDATE: Intensity:  *** Duration:  *** Frequency:  *** Frequency of abortive medication: *** Current NSAIDS/analgesics:  Excedrin Migraine, Tylenol Sinus Current triptans:  none Current ergotamine:  none Current anti-emetic:  none Current muscle relaxants:  none Current Antihypertensive medications:  none Current Antidepressant medications:  fluoxetine  20mg  daily Current Anticonvulsant medications:  none Current anti-CGRP:  none Current Vitamins/Herbal/Supplements:  magnesium oxide 400mg  daily, MVI Current Antihistamines/Decongestants:  Xyzal , Flonase , levocetirizine Other therapy:  none   Caffeine:  2 cups of coffee daily.  Tea.  No soda Alcohol:  occasional Smoker:  never Diet:  50 oz water daily.  Sometimes may skip lunch Exercise:  pilates Depression:  stable; Anxiety:  stable Other pain:  limited range of motion in neck, some left shoulder discomfort  Sleep hygiene:  Tries to get 9 hours a night.  Interrupted.  Sleeps with her dog.  Usually no excessive daytime sleepiness.  Prior sleep study positive for mild OSA. Could not tolerate CPAP.  HISTORY: Onset:  around 2022 Location:  periorbital unilateral or bilateral Quality:  pressure, throbbing Intensity:  8/10.   Aura:  absent Prodrome:  absent Associated symptoms:  photophobia, phonophobia.  She denies associated nausea,  vomiting, visual disturbance, unilateral numbness or weakness. Duration:  all day (1 hour with Excedrin) Frequency:  daily prior to the Fall.  Told to stop taking daily Tylenol Sinus.  Since then, frequency varies from few times a week to few times a month.  She had 3 in past 4 weeks. Sometimes she wakes up with the headache. Frequency of abortive medication: Excedrin only when she has a headache Triggers:  Unsure.  Possibly wine Relieving factors:  Excedrin Activity:  aggravates.  10/05/2022 CT SINUSES WO:  No evidence for sinus disease   She has history of migraines, which is associated with nausea and photophobia.  More severe.  Retrorbital.  They occur once or twice a year.  Past NSAIDS/analgesics:  meloxicam , Tylenol Sinus, Motrin, Tyenol Past abortive triptans:  none Past abortive ergotamine:  none Past muscle relaxants:  none Past anti-emetic:  none Past antihypertensive medications:  none Past antidepressant medications:  Wellbutrin (dizziness) Past anticonvulsant medications:  none Past anti-CGRP:  none Past antihistamines/decongestants:  cetirizine , fexofenadine, azelastine  Other past therapies:  none   Family history of headache:  no  PAST MEDICAL HISTORY: Past Medical History:  Diagnosis Date   Allergy     Colon polyps    Constipation    uses figs and that helps    High cholesterol    History of colon polyps    Hormone disorder    Hx of ovarian cyst    Peptic ulcer     MEDICATIONS: Current Outpatient Medications on File Prior to Visit  Medication Sig Dispense Refill   atorvastatin (LIPITOR) 20 MG tablet Take by mouth.     cholecalciferol (VITAMIN D ) 1000 units tablet Take 1,000 Units by mouth daily.     cycloSPORINE  (RESTASIS ) 0.05 %  ophthalmic emulsion Place 1 drop into both eyes 2 (two) times daily. 1.5 mL 1   EPINEPHrine  (EPIPEN  2-PAK) 0.3 mg/0.3 mL IJ SOAJ injection Inject 0.3 mg into the muscle as needed for anaphylaxis. 2 each 2   fluticasone   (FLONASE ) 50 MCG/ACT nasal spray Place 2 sprays into both nostrils daily. 11 g 11   magnesium gluconate (MAGONATE) 500 MG tablet Take 500 mg by mouth 2 (two) times daily.     Multiple Vitamin (MULTIVITAMIN) tablet Take 1 tablet by mouth daily.     olopatadine  (PATANOL) 0.1 % ophthalmic solution INSTILL 1 DROP IN EACH EYE TWICE A DAY FOR ALLERGIES 5 mL 11   RESTASIS  0.05 % ophthalmic emulsion Instill 1 drop in both eyes twice daily as directed 180 each 3   valACYclovir  (VALTREX ) 500 MG tablet Take one tablet (500 mg dose) by mouth as needed. 14 tablet 5   valACYclovir  (VALTREX ) 500 MG tablet Take one tablet (500 mg dose) by mouth as needed. 14 tablet 5   No current facility-administered medications on file prior to visit.    ALLERGIES: Allergies  Allergen Reactions   Azithromycin Other (See Comments)    Blisters     FAMILY HISTORY: Family History  Problem Relation Age of Onset   Skin cancer Mother    Heart attack Mother    Hypertension Mother    Arthritis Mother    Diabetes Mother    Colon cancer Father    COPD Father    Allergic rhinitis Sister    Breast cancer Sister    Hypertension Sister    Allergic rhinitis Sister    Hashimoto's thyroiditis Sister    Hypertension Brother    Colon polyps Brother    Heart attack Brother    Colon polyps Brother    Breast cancer Maternal Aunt    Colon cancer Maternal Grandmother    Esophageal cancer Neg Hx    Rectal cancer Neg Hx    Stomach cancer Neg Hx       Objective:  *** General: No acute distress.  Patient appears ***-groomed.   Head:  Normocephalic/atraumatic Eyes:  Fundi examined but not visualized Neck: supple, no paraspinal tenderness, full range of motion Heart:  Regular rate and rhythm Neurological Exam: alert and oriented.  Speech fluent and not dysarthric, language intact.  CN II-XII intact. Bulk and tone normal, muscle strength 5/5 throughout.  Sensation to light touch intact.  Deep tendon reflexes 2+ throughout,  toes downgoing.  Finger to nose testing intact.  Gait normal, Romberg negative.   Juliene Dunnings, DO  CC: ***

## 2023-09-22 ENCOUNTER — Encounter: Payer: Self-pay | Admitting: Neurology

## 2023-09-22 ENCOUNTER — Ambulatory Visit (INDEPENDENT_AMBULATORY_CARE_PROVIDER_SITE_OTHER): Payer: Non-veteran care | Admitting: Neurology

## 2023-09-22 VITALS — BP 101/71 | HR 74 | Resp 20 | Ht 63.0 in | Wt 155.0 lb

## 2023-09-22 DIAGNOSIS — G43109 Migraine with aura, not intractable, without status migrainosus: Secondary | ICD-10-CM

## 2023-11-28 ENCOUNTER — Other Ambulatory Visit: Payer: Self-pay | Admitting: Medical Genetics

## 2023-12-11 ENCOUNTER — Encounter (INDEPENDENT_AMBULATORY_CARE_PROVIDER_SITE_OTHER): Payer: Self-pay

## 2023-12-11 ENCOUNTER — Ambulatory Visit (INDEPENDENT_AMBULATORY_CARE_PROVIDER_SITE_OTHER)

## 2023-12-11 VITALS — BP 101/71 | HR 72 | Ht 63.0 in | Wt 149.0 lb

## 2023-12-11 DIAGNOSIS — J302 Other seasonal allergic rhinitis: Secondary | ICD-10-CM | POA: Diagnosis not present

## 2023-12-11 DIAGNOSIS — J342 Deviated nasal septum: Secondary | ICD-10-CM | POA: Diagnosis not present

## 2023-12-11 DIAGNOSIS — J343 Hypertrophy of nasal turbinates: Secondary | ICD-10-CM | POA: Diagnosis not present

## 2023-12-11 DIAGNOSIS — J329 Chronic sinusitis, unspecified: Secondary | ICD-10-CM

## 2023-12-11 DIAGNOSIS — R519 Headache, unspecified: Secondary | ICD-10-CM | POA: Diagnosis not present

## 2023-12-11 MED ORDER — IPRATROPIUM BROMIDE 0.03 % NA SOLN: 2.0000 | Freq: Two times a day (BID) | NASAL | 12 refills | Status: AC

## 2023-12-11 NOTE — Patient Instructions (Signed)
 I have ordered an imaging study for you to complete prior to your next visit. Please call [facility] Scheduling at [phone] to schedule your imaging if you have not received a call within 24 hours. If you are unable to complete your imaging study prior to your next scheduled visit please call our office to let us  know.

## 2023-12-11 NOTE — Progress Notes (Signed)
 Dear Dr. Vicenta, Here is my assessment for our mutual patient, Carolanne Mercier. Thank you for allowing me the opportunity to care for your patient. Please do not hesitate to contact me should you have any other questions. Sincerely, Dr. Hadassah Parody  Otolaryngology Clinic Note Referring provider: Dr. Vicenta HPI:   Initial HPI (12/11/2023) Discussed the use of AI scribe software for clinical note transcription with the patient, who gave verbal consent to proceed.  History of Present Illness Paislea Hatton is a 63 year old female with chronic sinus issues and allergies who presents with persistent headaches and postnasal drip.  Headache and facial pressure - Persistent headaches described as a sensation of a 'mask tight on my head' - Headaches are exacerbated by weather changes and centered around her eyes - She has seen a neurologist who thought she might have ocular migraine but pt did not want to start medication due to desire to decrease medication burden, wants to be sure sinuses are not contributory.   Chronic rhinorrhea and postnasal drip - Chronic postnasal drip with frequent need for tissues due to a runny nose - Sensation of mucus going down the back of her throat - Chronic throat clearing - Some anterior rhinorrhea as well  - No frequent coughing - No frequent sinus infections requiring antibiotics - No recent significant sinus infection  Allergic rhinitis and allergen exposure - Allergic to molds, termites, and certain trees - Tried allergy  shots but had localized skin reactions and minimal improvement so she stopped  - Uses Zyrtec  daily - Tried Flonase  and Astelin  nasal sprays with minimal relief; discontinued Astelin  due to discomfort - Has not tried Atrovent   Nasal obstruction and septal deviation - Diagnosed with a deviated septum in 2019; declined surgical intervention - Able to breathe through her nose during the day relatively well    Independent Review of  Additional Tests or Records:  Referral Jon Vicenta (11/24/23) : hx of allergies and chronic sinusitis. Previously on shots. Asking for ENT /allergy  referral   Note from Alm Bouche MD (04/23/22): seen for chronic sinusitis and had CT performed at that time. CT showed leftward nasal septal deviation, inferior turbinate hypertrophy, and normal appearing sinuses with open drainage pathways. He recommended septoplasty and ITR and pt did not proceed with this    PMH/Meds/All/SocHx/FamHx/ROS:   Past Medical History:  Diagnosis Date   Allergy     Colon polyps    Constipation    uses figs and that helps    High cholesterol    History of colon polyps    Hormone disorder    Hx of ovarian cyst    Peptic ulcer      Past Surgical History:  Procedure Laterality Date   BUNIONECTOMY Bilateral    COLONOSCOPY     OSTEOCHONDROMA EXCISION     POLYPECTOMY     UPPER GASTROINTESTINAL ENDOSCOPY     WISDOM TOOTH EXTRACTION      Family History  Problem Relation Age of Onset   Skin cancer Mother    Heart attack Mother    Hypertension Mother    Arthritis Mother    Diabetes Mother    Colon cancer Father    COPD Father    Allergic rhinitis Sister    Breast cancer Sister    Hypertension Sister    Allergic rhinitis Sister    Hashimoto's thyroiditis Sister    Hypertension Brother    Colon polyps Brother    Heart attack Brother    Colon polyps Brother  Breast cancer Maternal Aunt    Colon cancer Maternal Grandmother    Esophageal cancer Neg Hx    Rectal cancer Neg Hx    Stomach cancer Neg Hx      Social Connections: Unknown (09/04/2022)   Received [facility]   Social Network    Social Network: Not on file     Current Outpatient Medications  Medication Instructions   atorvastatin (LIPITOR) 20 MG tablet Take by mouth.   cholecalciferol (VITAMIN D ) 1,000 Units, Daily   cycloSPORINE  (RESTASIS ) 0.05 % ophthalmic emulsion 1 drop, Both Eyes, 2 times daily   EPINEPHrine  (EPIPEN   2-PAK) 0.3 mg, Intramuscular, As needed   fluticasone  (FLONASE ) 50 MCG/ACT nasal spray 2 sprays, Each Nare, Daily   ipratropium (ATROVENT ) 0.03 % nasal spray 2 sprays, Each Nare, Every 12 hours   magnesium gluconate (MAGONATE) 500 mg, 2 times daily   Multiple Vitamin (MULTIVITAMIN) tablet 1 tablet, Daily   olopatadine  (PATANOL) 0.1 % ophthalmic solution INSTILL 1 DROP IN EACH EYE TWICE A DAY FOR ALLERGIES   RESTASIS  0.05 % ophthalmic emulsion Instill 1 drop in both eyes twice daily as directed   valACYclovir  (VALTREX ) 500 MG tablet Take one tablet (500 mg dose) by mouth as needed.   valACYclovir  (VALTREX ) 500 MG tablet Take one tablet (500 mg dose) by mouth as needed.     Physical Exam:   BP 101/71 (BP Location: Left Arm, Patient Position: Sitting)   Pulse 72   Ht 5' 3 (1.6 m)   Wt 149 lb (67.6 kg)   LMP 01/26/2015 (Approximate)   SpO2 95%   BMI 26.39 kg/m   Salient findings:  CN II-XII intact  Bilateral EAC clear and TM intact with well pneumatized middle ear spaces Anterior rhinoscopy: Septum leftward deviated; bilateral inferior turbinates with hypertrophy Nasal endoscopy was indicated to better evaluate the nose and paranasal sinuses, given the patient's history and exam findings, and is detailed below.  No lesions of oral cavity/oropharynx No obviously palpable neck masses/lymphadenopathy/thyromegaly No respiratory distress or stridor  Seprately Identifiable Procedures:  Prior to initiating any procedures, risks/benefits/alternatives were explained to the patient and verbal consent obtained.  PROCEDURE (12/11/2023): Bilateral Diagnostic Rigid Nasal Endoscopy Pre-procedure diagnosis: Concern for chronic sinusitis  Post-procedure diagnosis: same Indication: See pre-procedure diagnosis and physical exam above Complications: None apparent EBL: 0 mL Anesthesia: Lidocaine 4% and topical decongestant was topically sprayed in each nasal cavity  Description of Procedure:   Patient was identified. A rigid 30 degree endoscope was utilized to evaluate the sinonasal cavities, mucosa, sinus ostia and turbinates and septum.  Overall, signs of mucosal inflammation are noted.  No mucopurulence, polyps, or masses noted.   Right Middle meatus: clear Right SE Recess: clear Left MM: clear Left SE Recess: clear Photodocumentation was obtained.  CPT CODE -- 68768 - Mod 25   Impression & Plans:  Shamyra Farias is a 63 y.o. female with   1. Facial pain   2. Seasonal allergic rhinitis, unspecified trigger   3. Hypertrophy of both inferior nasal turbinates   4. Nasal septal deviation   5. Chronic sinusitis, unspecified location     Assessment and Plan Assessment & Plan Chronic allergic rhinitis with postnasal drip - Trial atrovent   Chronic facial pain Chronic sinusitis  Had a long discussion that facial pain differential includes both sinus disease as well as migraine. Given that her prior CT scan read was clear (requesting imaging) and neurologist thought she may have ocular migraine, I think this is the  most likely cause of her facial pain. Pt would rather not start migraine medication unless able to rule out sinus disease.  - Will work on retrieving old CT scan - Given persistent and worsening facial pain symptoms will get new scan to ensure no changes in sinuses. Will attempt to get this scan during a time where she is having bad headaches. Instructed her to call to schedule scan when she is symptomatic . - Encouraged follow-up with neurology for migraine treatment options.  Deviated nasal septum Inferior turbinate hypertrophy  Deviated septum and inferior turbinate hypertrophy noted, no significant obstruction or breathing difficulties. Surgery previously declined. Will continue to monitor this.    See below regarding exact medications prescribed this encounter including dosages and route: Meds ordered this encounter  Medications   ipratropium (ATROVENT )  0.03 % nasal spray    Sig: Place 2 sprays into both nostrils every 12 (twelve) hours.    Dispense:  30 mL    Refill:  12    Thank you for allowing me the opportunity to care for your patient. Please do not hesitate to contact me should you have any other questions.  Sincerely, Hadassah Parody, MD Otolaryngologist (ENT), [facility] Phone: [phone] Fax: [phone]

## 2023-12-13 ENCOUNTER — Telehealth (INDEPENDENT_AMBULATORY_CARE_PROVIDER_SITE_OTHER): Payer: Self-pay

## 2023-12-13 NOTE — Telephone Encounter (Signed)
 I left a detailed message for the patient to return our call to change her appointment she has scheduled with the doctor. The doctor will not be in the office on 02/05/24.

## 2024-01-09 ENCOUNTER — Ambulatory Visit (HOSPITAL_COMMUNITY)
Admission: RE | Admit: 2024-01-09 | Discharge: 2024-01-09 | Disposition: A | Discharge status: Home / Self Care | Source: Ambulatory Visit

## 2024-01-09 DIAGNOSIS — J302 Other seasonal allergic rhinitis: Secondary | ICD-10-CM | POA: Insufficient documentation

## 2024-01-10 ENCOUNTER — Other Ambulatory Visit

## 2024-01-10 DIAGNOSIS — Z006 Encounter for examination for normal comparison and control in clinical research program: Secondary | ICD-10-CM

## 2024-01-22 LAB — GENECONNECT MOLECULAR SCREEN: Genetic Analysis Overall Interpretation: NEGATIVE

## 2024-02-05 ENCOUNTER — Ambulatory Visit (INDEPENDENT_AMBULATORY_CARE_PROVIDER_SITE_OTHER)

## 2024-02-07 ENCOUNTER — Encounter (INDEPENDENT_AMBULATORY_CARE_PROVIDER_SITE_OTHER): Payer: Self-pay

## 2024-02-07 ENCOUNTER — Ambulatory Visit (INDEPENDENT_AMBULATORY_CARE_PROVIDER_SITE_OTHER)

## 2024-02-07 VITALS — BP 118/75 | HR 65

## 2024-02-07 DIAGNOSIS — G8929 Other chronic pain: Secondary | ICD-10-CM

## 2024-02-07 DIAGNOSIS — R0982 Postnasal drip: Secondary | ICD-10-CM | POA: Diagnosis not present

## 2024-02-07 DIAGNOSIS — J3489 Other specified disorders of nose and nasal sinuses: Secondary | ICD-10-CM

## 2024-02-07 DIAGNOSIS — J343 Hypertrophy of nasal turbinates: Secondary | ICD-10-CM | POA: Diagnosis not present

## 2024-02-07 DIAGNOSIS — R519 Headache, unspecified: Secondary | ICD-10-CM

## 2024-02-07 DIAGNOSIS — R0989 Other specified symptoms and signs involving the circulatory and respiratory systems: Secondary | ICD-10-CM | POA: Diagnosis not present

## 2024-02-07 DIAGNOSIS — J342 Deviated nasal septum: Secondary | ICD-10-CM

## 2024-02-07 NOTE — Progress Notes (Signed)
 Dear Dr. Vicenta, Here is my assessment for our mutual patient, Destiny Lewis. Thank you for allowing me the opportunity to care for your patient. Please do not hesitate to contact me should you have any other questions. Sincerely, Dr. Hadassah Parody  Otolaryngology Clinic Note Referring provider: Dr. Vicenta HPI:   Initial HPI (12/11/2023) Discussed the use of AI scribe software for clinical note transcription with the patient, who gave verbal consent to proceed.  Destiny Lewis is a 63 year old female with chronic sinus issues and allergies who presents with persistent headaches and postnasal drip.  Headache and facial pressure - Persistent headaches described as a sensation of a 'mask tight on my head' - Headaches are exacerbated by weather changes and centered around her eyes - She has seen a neurologist who thought she might have ocular migraine but pt did not want to start medication due to desire to decrease medication burden, wants to be sure sinuses are not contributory.   Chronic rhinorrhea and postnasal drip - Chronic postnasal drip with frequent need for tissues due to a runny nose - Sensation of mucus going down the back of her throat - Chronic throat clearing - Some anterior rhinorrhea as well  - No frequent coughing - No frequent sinus infections requiring antibiotics - No recent significant sinus infection  Allergic rhinitis and allergen exposure - Allergic to molds, termites, and certain trees - Tried allergy  shots but had localized skin reactions and minimal improvement so she stopped  - Uses Zyrtec  daily - Tried Flonase  and Astelin  nasal sprays with minimal relief; discontinued Astelin  due to discomfort - Has not tried Atrovent   Nasal obstruction and septal deviation - Diagnosed with a deviated septum in 2019; declined surgical intervention - Able to breathe through her nose during the day relatively well   History of Present  Illness --------------------------------------------------------- 02/07/2024   Here for f/u after CT scan. She had a cold at the time of the CT. Otherwise has not had headaches recently   Still having postnasal drip and throat clearing but not significantly bothered by this.    Independent Review of Additional Tests or Records:  Referral Destiny Lewis (11/24/23) : hx of allergies and chronic sinusitis. Previously on shots. Asking for ENT /allergy  referral   Note from Alm Bouche MD (04/23/22): seen for chronic sinusitis and had CT performed at that time. CT showed leftward nasal septal deviation, inferior turbinate hypertrophy, and normal appearing sinuses with open drainage pathways. He recommended septoplasty and ITR and pt did not proceed with this   CT sinus 01/09/24 independently reviewed showing clear paranasal sinuses with small amount of mucus in left max. Right NSD, bilateral ITH, concha bullosa on right    PMH/Meds/All/SocHx/FamHx/ROS:   Past Medical History:  Diagnosis Date   Allergy     Colon polyps    Constipation    uses figs and that helps    High cholesterol    History of colon polyps    Hormone disorder    Hx of ovarian cyst    Peptic ulcer      Past Surgical History:  Procedure Laterality Date   BUNIONECTOMY Bilateral    COLONOSCOPY     OSTEOCHONDROMA EXCISION     POLYPECTOMY     UPPER GASTROINTESTINAL ENDOSCOPY     WISDOM TOOTH EXTRACTION      Family History  Problem Relation Age of Onset   Skin cancer Mother    Heart attack Mother    Hypertension Mother  Arthritis Mother    Diabetes Mother    Colon cancer Father    COPD Father    Allergic rhinitis Sister    Breast cancer Sister    Hypertension Sister    Allergic rhinitis Sister    Hashimoto's thyroiditis Sister    Hypertension Brother    Colon polyps Brother    Heart attack Brother    Colon polyps Brother    Breast cancer Maternal Aunt    Colon cancer Maternal Grandmother     Esophageal cancer Neg Hx    Rectal cancer Neg Hx    Stomach cancer Neg Hx      Social Connections: Socially Integrated (08/29/2021)   Received from [facility]   Social Network    How would you rate your social network (family, work, friends)?: Good participation with social networks     Current Outpatient Medications  Medication Instructions   atorvastatin (LIPITOR) 20 MG tablet Take by mouth.   cholecalciferol (VITAMIN D ) 1,000 Units, Daily   cycloSPORINE  (RESTASIS ) 0.05 % ophthalmic emulsion 1 drop, Both Eyes, 2 times daily   EPINEPHrine  (EPIPEN  2-PAK) 0.3 mg, Intramuscular, As needed   fluticasone  (FLONASE ) 50 MCG/ACT nasal spray 2 sprays, Each Nare, Daily   ipratropium (ATROVENT ) 0.03 % nasal spray 2 sprays, Each Nare, Every 12 hours   magnesium gluconate (MAGONATE) 500 mg, 2 times daily   Multiple Vitamin (MULTIVITAMIN) tablet 1 tablet, Daily   olopatadine  (PATANOL) 0.1 % ophthalmic solution INSTILL 1 DROP IN EACH EYE TWICE A DAY FOR ALLERGIES   RESTASIS  0.05 % ophthalmic emulsion Instill 1 drop in both eyes twice daily as directed   valACYclovir  (VALTREX ) 500 MG tablet Take one tablet (500 mg dose) by mouth as needed.   valACYclovir  (VALTREX ) 500 MG tablet Take one tablet (500 mg dose) by mouth as needed.     Physical Exam:   BP 118/75 (BP Location: Right Arm, Patient Position: Sitting)   Pulse 65   LMP 01/26/2015   SpO2 95%   Salient findings:  CN II-XII intact  Bilateral EAC clear and TM intact with well pneumatized middle ear spaces Anterior rhinoscopy: Septum slightly drybilateral inferior turbinates with hypertrophy  No lesions of oral cavity/oropharynx No obviously palpable neck masses/lymphadenopathy/thyromegaly No respiratory distress or stridor  Seprately Identifiable Procedures:  Prior to initiating any procedures, risks/benefits/alternatives were explained to the patient and verbal consent obtained. none   Impression & Plans:  Destiny Lewis is a 63  y.o. female with   1. Chronic facial pain   2. Nasal septal deviation   3. Hypertrophy of both inferior nasal turbinates   4. Concha bullosa   5. Post-nasal drip   6. Throat clearing      Assessment and Plan Assessment & Plan Chronic allergic rhinitis with postnasal drip - Using atrovent  intermittently  - Discussed post-nasal drip can be normal and may be aggravated by chronic throat clearing. Encouraged cessation of throat clearing; drink water when wanting to throat clear.  Chronic facial pain Had a long discussion that facial pain differential includes both sinus disease as well as migraine. Prior neurologist thought might be ocular migraine.I think this is the most likely cause of her facial pain. Pt would rather not start migraine medication unless able to rule out sinus disease. CT was obtained to r/o sinus disease and this was clear.  - Encouraged follow-up with neurology for migraine treatment options.  Deviated nasal septum Inferior turbinate hypertrophy  Deviated septum and inferior turbinate hypertrophy noted, no significant  obstruction or breathing difficulties. Surgery previously declined. Will continue to monitor this. And she will let us  know if interested in future.    See below regarding exact medications prescribed this encounter including dosages and route: No orders of the defined types were placed in this encounter.   Thank you for allowing me the opportunity to care for your patient. Please do not hesitate to contact me should you have any other questions.  Sincerely, Hadassah Parody, MD Otolaryngologist (ENT), [facility] Phone: [phone] Fax: [phone]

## 2024-03-26 ENCOUNTER — Telehealth: Payer: Self-pay | Admitting: Neurology

## 2024-03-26 NOTE — Telephone Encounter (Signed)
 Pt. Needs [place] Auth ASAP please call once rcvd back from [place]

## 2024-10-08 ENCOUNTER — Ambulatory Visit: Payer: Self-pay | Admitting: Neurology
